# Patient Record
Sex: Female | Born: 1960 | Race: White | Hispanic: No | Marital: Married | Smoking: Current some day smoker
Health system: Southern US, Community
[De-identification: ages and names within clinical notes are randomized; demographics above are authoritative.]

## PROBLEM LIST (undated history)

## (undated) DIAGNOSIS — A64 Unspecified sexually transmitted disease: Secondary | ICD-10-CM

## (undated) DIAGNOSIS — J45909 Unspecified asthma, uncomplicated: Secondary | ICD-10-CM

## (undated) DIAGNOSIS — Z5189 Encounter for other specified aftercare: Secondary | ICD-10-CM

## (undated) DIAGNOSIS — C801 Malignant (primary) neoplasm, unspecified: Secondary | ICD-10-CM

## (undated) DIAGNOSIS — L570 Actinic keratosis: Secondary | ICD-10-CM

## (undated) DIAGNOSIS — G43109 Migraine with aura, not intractable, without status migrainosus: Secondary | ICD-10-CM

## (undated) DIAGNOSIS — D649 Anemia, unspecified: Secondary | ICD-10-CM

## (undated) DIAGNOSIS — R232 Flushing: Secondary | ICD-10-CM

## (undated) DIAGNOSIS — F419 Anxiety disorder, unspecified: Secondary | ICD-10-CM

## (undated) DIAGNOSIS — R87619 Unspecified abnormal cytological findings in specimens from cervix uteri: Secondary | ICD-10-CM

## (undated) DIAGNOSIS — A749 Chlamydial infection, unspecified: Secondary | ICD-10-CM

## (undated) DIAGNOSIS — A549 Gonococcal infection, unspecified: Secondary | ICD-10-CM

## (undated) DIAGNOSIS — F32A Depression, unspecified: Secondary | ICD-10-CM

## (undated) DIAGNOSIS — T7840XA Allergy, unspecified, initial encounter: Secondary | ICD-10-CM

## (undated) HISTORY — DX: Chlamydial infection, unspecified: A74.9

## (undated) HISTORY — PX: ECTOPIC PREGNANCY SURGERY: SHX613

## (undated) HISTORY — DX: Encounter for other specified aftercare: Z51.89

## (undated) HISTORY — DX: Migraine with aura, not intractable, without status migrainosus: G43.109

## (undated) HISTORY — PX: WISDOM TOOTH EXTRACTION: SHX21

## (undated) HISTORY — DX: Actinic keratosis: L57.0

## (undated) HISTORY — DX: Flushing: R23.2

## (undated) HISTORY — DX: Allergy, unspecified, initial encounter: T78.40XA

## (undated) HISTORY — DX: Anemia, unspecified: D64.9

## (undated) HISTORY — DX: Anxiety disorder, unspecified: F41.9

## (undated) HISTORY — PX: CRYOTHERAPY: SHX1416

## (undated) HISTORY — DX: Malignant (primary) neoplasm, unspecified: C80.1

## (undated) HISTORY — DX: Gonococcal infection, unspecified: A54.9

## (undated) HISTORY — PX: DILATION AND CURETTAGE OF UTERUS: SHX78

## (undated) HISTORY — DX: Unspecified asthma, uncomplicated: J45.909

## (undated) HISTORY — DX: Unspecified abnormal cytological findings in specimens from cervix uteri: R87.619

## (undated) HISTORY — DX: Depression, unspecified: F32.A

## (undated) HISTORY — DX: Unspecified sexually transmitted disease: A64

## (undated) HISTORY — PX: TUBAL LIGATION: SHX77

## (undated) HISTORY — PX: ABLATION: SHX5711

---

## 1998-11-16 ENCOUNTER — Encounter: Payer: Self-pay | Admitting: Infectious Diseases

## 1998-11-16 ENCOUNTER — Ambulatory Visit (HOSPITAL_COMMUNITY): Admission: RE | Admit: 1998-11-16 | Discharge: 1998-11-16 | Payer: Self-pay | Admitting: Infectious Diseases

## 1999-02-14 ENCOUNTER — Other Ambulatory Visit: Admission: RE | Admit: 1999-02-14 | Discharge: 1999-02-14 | Payer: Self-pay | Admitting: Obstetrics & Gynecology

## 2000-03-27 ENCOUNTER — Other Ambulatory Visit: Admission: RE | Admit: 2000-03-27 | Discharge: 2000-03-27 | Payer: Self-pay | Admitting: Obstetrics & Gynecology

## 2000-12-18 ENCOUNTER — Encounter: Payer: Self-pay | Admitting: Obstetrics & Gynecology

## 2000-12-18 ENCOUNTER — Encounter: Admission: RE | Admit: 2000-12-18 | Discharge: 2000-12-18 | Payer: Self-pay | Admitting: Family Medicine

## 2001-04-24 ENCOUNTER — Other Ambulatory Visit: Admission: RE | Admit: 2001-04-24 | Discharge: 2001-04-24 | Payer: Self-pay | Admitting: Obstetrics & Gynecology

## 2002-05-25 ENCOUNTER — Other Ambulatory Visit: Admission: RE | Admit: 2002-05-25 | Discharge: 2002-05-25 | Payer: Self-pay | Admitting: Obstetrics & Gynecology

## 2003-06-23 ENCOUNTER — Other Ambulatory Visit: Admission: RE | Admit: 2003-06-23 | Discharge: 2003-06-23 | Payer: Self-pay | Admitting: Obstetrics & Gynecology

## 2004-08-14 ENCOUNTER — Other Ambulatory Visit: Admission: RE | Admit: 2004-08-14 | Discharge: 2004-08-14 | Payer: Self-pay | Admitting: Obstetrics & Gynecology

## 2004-08-31 ENCOUNTER — Encounter: Admission: RE | Admit: 2004-08-31 | Discharge: 2004-08-31 | Payer: Self-pay | Admitting: Obstetrics & Gynecology

## 2005-09-27 ENCOUNTER — Other Ambulatory Visit: Admission: RE | Admit: 2005-09-27 | Discharge: 2005-09-27 | Payer: Self-pay | Admitting: Obstetrics & Gynecology

## 2007-08-15 ENCOUNTER — Ambulatory Visit: Payer: Self-pay | Admitting: Internal Medicine

## 2008-07-29 ENCOUNTER — Encounter: Admission: RE | Admit: 2008-07-29 | Discharge: 2008-07-29 | Payer: Self-pay | Admitting: Obstetrics & Gynecology

## 2008-08-31 ENCOUNTER — Ambulatory Visit: Payer: Self-pay | Admitting: Internal Medicine

## 2008-08-31 DIAGNOSIS — J309 Allergic rhinitis, unspecified: Secondary | ICD-10-CM | POA: Insufficient documentation

## 2008-08-31 DIAGNOSIS — Z8611 Personal history of tuberculosis: Secondary | ICD-10-CM | POA: Insufficient documentation

## 2008-08-31 DIAGNOSIS — J069 Acute upper respiratory infection, unspecified: Secondary | ICD-10-CM | POA: Insufficient documentation

## 2008-08-31 DIAGNOSIS — L509 Urticaria, unspecified: Secondary | ICD-10-CM | POA: Insufficient documentation

## 2009-09-22 ENCOUNTER — Encounter: Admission: RE | Admit: 2009-09-22 | Discharge: 2009-09-22 | Payer: Self-pay | Admitting: Obstetrics & Gynecology

## 2010-03-24 ENCOUNTER — Ambulatory Visit (HOSPITAL_COMMUNITY): Admission: RE | Admit: 2010-03-24 | Discharge: 2010-03-24 | Payer: Self-pay | Admitting: Gastroenterology

## 2010-07-18 ENCOUNTER — Encounter: Admission: RE | Admit: 2010-07-18 | Discharge: 2010-07-18 | Payer: Self-pay | Admitting: Obstetrics & Gynecology

## 2010-10-22 ENCOUNTER — Encounter: Payer: Self-pay | Admitting: Obstetrics & Gynecology

## 2011-01-16 ENCOUNTER — Other Ambulatory Visit: Payer: Self-pay | Admitting: Obstetrics & Gynecology

## 2011-01-16 DIAGNOSIS — Z1231 Encounter for screening mammogram for malignant neoplasm of breast: Secondary | ICD-10-CM

## 2011-01-22 ENCOUNTER — Ambulatory Visit
Admission: RE | Admit: 2011-01-22 | Discharge: 2011-01-22 | Disposition: A | Discharge status: Home / Self Care | Payer: 59 | Source: Ambulatory Visit | Attending: Obstetrics & Gynecology | Admitting: Obstetrics & Gynecology

## 2011-01-22 DIAGNOSIS — Z1231 Encounter for screening mammogram for malignant neoplasm of breast: Secondary | ICD-10-CM

## 2011-02-16 NOTE — Assessment & Plan Note (Signed)
[facility]                             PULMONARY OFFICE NOTE   Abigail Foley, Abigail Foley                   MRN:          [id number]  DATE:08/15/2007                            DOB:          [birth year]    Date of visit:  August 15, 2007.   PROBLEM:  Former patient at the old office wishing now to be seen here  for concern of cough.   HISTORY:  For the past six weeks she has had an intermittent cough most  days aggravated by rain and exertion.  It began in the first week of  October.  She feels tight around the chest but does not notice wheezing.  She started Mucinex.  This loosened some green or brown sputum at the  beginning, but that has cleared and only the cough has lingered.  She  may feel a little bit short of breath.  There is some post nasal drip  and complaint of halitosis from her husband, but no headache and she  blows nothing from her nose.  She had been told in the past that chest x-  ray questioned COPD and she does smoke.   MEDICATIONS:  Claritin, fish oil, multivitamin.   DRUG INTOLERANCES:  1. CECLOR with anaphylaxis.  2. FEMSTAT.   REVIEW OF SYSTEMS:  Dyspnea at rest and with exertion, productive and  nonproductive coughs.  Chest tightness.  No weight loss, adenopathy,  blood, joint pain, rash, changes in bowel or bladder, or leg edema.   PAST MEDICAL HISTORY:  1. She associated conjunctivitis with a flu vaccine many years ago.  2. No ENT surgery.  3. Previous urticaria, because of which she stays on daily Claritin      with no urticaria in a very long time.  4. Allergic rhinitis.  5. 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  6. Tubal pregnancy treated surgically in 1993.  7. Two C-sections.  8. Wisdom teeth extraction.  9. No intolerance to latex, contrast dye, or aspirin.   SOCIAL HISTORY:  She quit smoking two years ago, had averaged one half  pack per day.  Social alcohol.   Married with children.  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   FAMILY HISTORY:  Father with emphysema, mother with asthma, others with  heart disease, strokes, and aneurysm, and cancer.   OBJECTIVE:  Weight 167 pounds, blood pressure 102/64, pulse 96, room air  saturation 99%.  She appears comfortable.  HEENT:  There is a mild, dry cough.  Nose and throat are clear.  LUNGS:  Clear with no wheeze or rhonchi.  HEART:  Heart sounds are regular without murmur.  NECK:  I do not find adenopathy, neck vein distention, stridor,  cyanosis, clubbing, or edema.  ABDOMEN:  No enlargement of liver or spleen.   IMPRESSION:  1. Cough that seemed to begin with an episode of bronchitis and      possibly some sinusitis.  2. History of allergic rhinitis.  3. History of Ceclor-related anaphylaxis.  PLAN:  1. Biaxin 500 mg b.i.d. x7 days with one refill.  2. Sample Proventil HFA two puffs q.i.d. p.r.n.  3. Chest x-ray.  4. She will get flu vaccine at work.  5. She will return p.r.n. if this fails to clear her.     Clinton D. Maple Hudson, MD, Abigail Foley, FACP  Electronically Signed    CDY/MedQ  DD: 08/16/2007  DT: 08/18/2007  Job #: [id number]   cc:   Olene Craven, M.D.

## 2011-10-03 ENCOUNTER — Other Ambulatory Visit (HOSPITAL_COMMUNITY): Payer: Self-pay | Admitting: Obstetrics & Gynecology

## 2011-10-03 DIAGNOSIS — R1032 Left lower quadrant pain: Secondary | ICD-10-CM

## 2011-10-05 ENCOUNTER — Ambulatory Visit (HOSPITAL_COMMUNITY): Admission: RE | Admit: 2011-10-05 | Payer: 59 | Source: Ambulatory Visit

## 2011-10-12 ENCOUNTER — Ambulatory Visit (HOSPITAL_COMMUNITY): Admission: RE | Admit: 2011-10-12 | Payer: 59 | Source: Ambulatory Visit

## 2012-04-10 ENCOUNTER — Other Ambulatory Visit: Payer: Self-pay | Admitting: Obstetrics & Gynecology

## 2012-10-14 ENCOUNTER — Other Ambulatory Visit: Payer: Self-pay | Admitting: Obstetrics & Gynecology

## 2012-10-14 DIAGNOSIS — Z1231 Encounter for screening mammogram for malignant neoplasm of breast: Secondary | ICD-10-CM

## 2012-11-06 ENCOUNTER — Ambulatory Visit
Admission: RE | Admit: 2012-11-06 | Discharge: 2012-11-06 | Disposition: A | Discharge status: Home / Self Care | Payer: 59 | Source: Ambulatory Visit | Attending: Obstetrics & Gynecology | Admitting: Obstetrics & Gynecology

## 2012-11-06 DIAGNOSIS — Z1231 Encounter for screening mammogram for malignant neoplasm of breast: Secondary | ICD-10-CM

## 2013-07-28 ENCOUNTER — Other Ambulatory Visit: Payer: Self-pay | Admitting: *Deleted

## 2013-07-28 DIAGNOSIS — M79609 Pain in unspecified limb: Secondary | ICD-10-CM

## 2013-07-28 DIAGNOSIS — I83893 Varicose veins of bilateral lower extremities with other complications: Secondary | ICD-10-CM

## 2013-08-05 ENCOUNTER — Encounter: Payer: Self-pay | Admitting: Vascular Surgery

## 2013-08-06 ENCOUNTER — Encounter: Payer: Self-pay | Admitting: Vascular Surgery

## 2013-08-06 ENCOUNTER — Ambulatory Visit (HOSPITAL_COMMUNITY)
Admission: RE | Admit: 2013-08-06 | Discharge: 2013-08-06 | Disposition: A | Discharge status: Home / Self Care | Payer: 59 | Source: Ambulatory Visit | Attending: Vascular Surgery | Admitting: Vascular Surgery

## 2013-08-06 ENCOUNTER — Ambulatory Visit (INDEPENDENT_AMBULATORY_CARE_PROVIDER_SITE_OTHER): Payer: 59 | Admitting: Vascular Surgery

## 2013-08-06 VITALS — BP 117/64 | HR 76 | Ht 65.0 in | Wt 180.0 lb

## 2013-08-06 DIAGNOSIS — I83893 Varicose veins of bilateral lower extremities with other complications: Secondary | ICD-10-CM | POA: Insufficient documentation

## 2013-08-06 DIAGNOSIS — M79609 Pain in unspecified limb: Secondary | ICD-10-CM | POA: Insufficient documentation

## 2013-08-06 NOTE — Progress Notes (Signed)
[facility] [place] HISTORY AND PHYSICAL   History of Present Illness:  Patient is a 52 y.o. year old female who presents for evaluation of bilateral aching in her lower extremities. The patient has a long-term history of bilateral varicose veins. These have been present for several years. She has had no bleeding episodes. But she does have periodic bruising that occurs in the same place on the medial aspect of her calves.  She denies family history of varicose veins. She denies prior history of DVT. She does develop swelling in her legs which progressively worsened as the day goes on. 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 The swelling is somewhat relieved by elevation at the end of the day. 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 She works with [employer].  Past Medical History  Diagnosis Date  . Anemia     Past Surgical History  Procedure Laterality Date  . Tubal ligation    . Cesarean section    . Dilation and curettage of uterus      Social History History  Substance Use Topics  . Smoking status: Former Smoker    Types: Cigarettes    Quit date: 10/01/2005  . Smokeless tobacco: Never Used  . Alcohol Use: Yes     Comment: occasional    Family History Family History  Problem Relation Age of Onset  . Hypertension Mother   . Diabetes Father   . Hyperlipidemia Father   . Hyperlipidemia Sister   . Cancer Brother   . Hyperlipidemia Brother   . Hypertension Brother     Allergies  Allergies  Allergen Reactions  . Cefaclor     REACTION: anaphalaxis     Current Outpatient Prescriptions  Medication Sig Dispense Refill  . BIOTIN PO Take 10,000 mcg by mouth daily.      . Cholecalciferol (VITAMIN D3) 2000 UNITS capsule Take 2,000 Units by mouth daily.      . cyanocobalamin 1000 MCG tablet Take 100 mcg by mouth daily.      Marland Kitchen loratadine (CLARITIN) 10 MG tablet Take 10 mg by mouth daily.        No current facility-administered medications for this visit.    ROS:   General:  No weight loss, Fever, chills  HEENT: No recent headaches, no nasal bleeding, no visual changes, no sore throat  Neurologic: No dizziness, blackouts, seizures. No recent symptoms of stroke or mini- stroke. No recent episodes of slurred speech, or temporary blindness.  Cardiac: No recent episodes of chest pain/pressure, no shortness of breath at rest.  No shortness of breath with exertion.  Denies history of atrial fibrillation or irregular heartbeat  Vascular: No history of rest pain in feet.  No history of claudication.  No history of non-healing ulcer, No history of DVT   Pulmonary: No home oxygen, no productive cough, no hemoptysis,  No asthma or wheezing  Musculoskeletal:  [ ]  Arthritis, [ ]  Low back pain,  [ ]  Joint pain  Hematologic:No history of hypercoagulable state.  No history of easy bleeding.  No history of anemia  Gastrointestinal: No hematochezia or melena,  No gastroesophageal reflux, no trouble swallowing  Urinary: [ ]  chronic Kidney disease, [ ]  on HD - [ ]  MWF or [ ]  TTHS, [ ]  Burning with urination, [ ]  Frequent urination, [ ]  Difficulty urinating;   Skin: No rashes  Psychological: No history of anxiety,  No history of depression  Physical Examination  Filed Vitals:   08/06/13 1012  BP: 117/64  Pulse: 76  Height: 5\' 5"  (1.651 m)  Weight: 180 lb (81.647 kg)  SpO2: 100%    Body mass index is 29.95 kg/(m^2).  General:  Alert and oriented, no acute distress HEENT: Normal Neck: No bruit or JVD Pulmonary: Clear to auscultation bilaterally Cardiac: Regular Rate and Rhythm without murmur Abdomen: Soft, non-tender, non-distended, no mass Skin: No rash, multiple clusters of varicosities on the right medial calf 3-4 mm diameter. Scattered spider-type varicosities on bilateral thigh and calf area. Extremity Pulses:  2+ radial, brachial, femoral, 1+dorsalis pedis, 2+ posterior  tibial pulses bilaterally Musculoskeletal: No deformity trace edema bilaterally  Neurologic: Upper and lower extremity motor 5/5 and symmetric  DATA:  The patient had bilateral venous reflux exam today. This showed reflux in the greater saphenous vein on the left and right side. She also had reflux an accessory right vein. Vein diameter was 3-6 mm bilaterally. She did not have a significant element of deep vein reflux. I reviewed and interpreted this study.   ASSESSMENT:  Bilateral superficial venous reflux, symptomatic with swelling bruising pain   PLAN:  The patient was given a prescription today for bilateral lower extremity compression stockings. She was also counseled elevate her legs at the end of the day. She will use nonsteroidals intermittently for pain if necessary. She will followup in 3 months time to consider laser ablation.  Fabienne Bruns, MD [facility] [place] Office: [phone] Pager: [phone]

## 2013-11-03 ENCOUNTER — Ambulatory Visit: Payer: 59 | Admitting: Vascular Surgery

## 2013-12-21 ENCOUNTER — Ambulatory Visit (INDEPENDENT_AMBULATORY_CARE_PROVIDER_SITE_OTHER): Payer: 59 | Admitting: Family Medicine

## 2013-12-21 ENCOUNTER — Encounter: Payer: Self-pay | Admitting: Family Medicine

## 2013-12-21 ENCOUNTER — Ambulatory Visit: Payer: 59

## 2013-12-21 VITALS — BP 118/62 | HR 90 | Temp 98.0°F | Resp 16 | Wt 178.0 lb

## 2013-12-21 DIAGNOSIS — R7303 Prediabetes: Secondary | ICD-10-CM

## 2013-12-21 DIAGNOSIS — R200 Anesthesia of skin: Secondary | ICD-10-CM

## 2013-12-21 DIAGNOSIS — R209 Unspecified disturbances of skin sensation: Secondary | ICD-10-CM

## 2013-12-21 DIAGNOSIS — R7309 Other abnormal glucose: Secondary | ICD-10-CM

## 2013-12-21 LAB — POCT GLYCOSYLATED HEMOGLOBIN (HGB A1C): Hemoglobin A1C: 5.7

## 2013-12-21 NOTE — Patient Instructions (Signed)
Let met know if your symptoms do not resolve in the next few weeks.  Your A1c test shows that you have pre-diabetes.  Work on moderate weight loss to correct this problem.

## 2013-12-21 NOTE — Progress Notes (Signed)
[facility] [address], [place] [address] [phone]  Date:  12/21/2013   Name:  Abigail Foley   DOB:  1961/02/11   MRN:  [id number]  PCP:  Lamar Blinks, MD    Chief Complaint: Leg Pain   History of Present Illness:  Abigail Foley is a 53 y.o. very pleasant female patient who presents with the following:  She is here today with burning in her lower legs and feet for a month or two. It may have been going on longer- but she did not notice it as much prior to the last month or two. She had some spider veins treated and the sx seemed to start after this.  She just had superficial treatment with ?saline. No varicose vein treatment.   Her left leg burns more than the right.  She does not note any back pain.   Symptoms started insidiously, she is not aware of any injury  She has a feeling like she is "warming up after being cold."  She is better in the morning- the sx will often be resolved in the am, and then get worse at the day goes on.   No history of DM.  She does not have any other numbness or weakness anywhere, never had this in the past.  Never had any other unusual neurological findings.    Patient Active Problem List   Diagnosis Date Noted  . Varicose veins of lower extremities with other complications 75/64/3329  . Pain in limb 08/06/2013  . UPPER RESPIRATORY INFECTION 08/31/2008  . ALLERGIC RHINITIS 08/31/2008  . 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 Anemia     Past Surgical History  Procedure Laterality Date  . Tubal ligation    . Cesarean section    . Dilation and curettage of uterus      History  Substance Use Topics  . Smoking status: Former Smoker    Types: Cigarettes    Quit date: 10/01/2005  . Smokeless tobacco: Never Used  . Alcohol Use: Yes     Comment: occasional    Family History  Problem Relation Age of Onset  . Hypertension Mother   . Diabetes  Father   . Hyperlipidemia Father   . Hyperlipidemia Sister   . Cancer Brother   . Hyperlipidemia Brother   . Hypertension Brother     Allergies  Allergen Reactions  . Cefaclor     REACTION: anaphalaxis    Medication list has been reviewed and updated.  Current Outpatient Prescriptions on File Prior to Visit  Medication Sig Dispense Refill  . BIOTIN PO Take 10,000 mcg by mouth daily.      . Cholecalciferol (VITAMIN D3) 2000 UNITS capsule Take 2,000 Units by mouth daily.      . cyanocobalamin 1000 MCG tablet Take 100 mcg by mouth daily.      Marland Kitchen loratadine (CLARITIN) 10 MG tablet Take 10 mg by mouth daily.       No current facility-administered medications on file prior to visit.    Review of Systems:  As per HPI- otherwise negative.   Physical Examination: Filed Vitals:   12/21/13 0909  BP: 118/62  Pulse: 90  Temp: 98 F (36.7 C)  Resp: 16   Filed Vitals:   12/21/13 0909  Weight: 178 lb (80.74 kg)   Body mass index is 29.62 kg/(m^2). Ideal Body Weight:    GEN: WDWN,  NAD, Non-toxic, A & O x 3, overweight but otherwise looks well HEENT: Atraumatic, Normocephalic. Neck supple. No masses, No LAD. Ears and Nose: No external deformity. CV: RRR, No M/G/R. No JVD. No thrill. No extra heart sounds. PULM: CTA B, no wheezes, crackles, rhonchi. No retractions. No resp. distress. No accessory muscle use. ABD: S, NT, ND EXTR: No c/c/e NEURO Normal gait.  PSYCH: Normally interactive. Conversant. Not depressed or anxious appearing.  Calm demeanor.  Feet: normal perfusion and cap refill, normal monofilament sensation.  Good pulses.  Normal perfusion of lower legs.  No changes to skin.  Normal strength and DTR of both LE.  She indicates the main problem area as the lateral left foot, over the lateral ankle,    Wt Readings from Last 3 Encounters:  12/21/13 178 lb (80.74 kg)  08/06/13 180 lb (81.647 kg)  08/31/08 173 lb 6.1 oz (78.645 kg)     Results for orders placed in  visit on 12/21/13  POCT GLYCOSYLATED HEMOGLOBIN (HGB A1C)      Result Value Ref Range   Hemoglobin A1C 5.7     [facility] reading (PRIMARY) by  Dr. Lorelei Pont. Lumbar spine: negative  LUMBAR SPINE - COMPLETE 4+ VIEW  COMPARISON: None.  FINDINGS: Normal alignment of the lumbar vertebral bodies. Disc spaces and vertebral bodies are maintained. No acute bony findings or destructive bony changes. The facets are normally aligned. No pars defects. Mild facet disease the visualized bony pelvis is intact.  IMPRESSION: Normal alignment and no acute bony findings  Assessment and Plan: Numbness in feet - Plan: POCT glycosylated hemoglobin (Hb A1C), DG Lumbar Spine Complete  Noted that she has pre- diabetes. Encouraged her to lose a few lbs to help with this.  Otherwise her exam and films look good. discussed trying a round of steroids but she would rather not do this.  She will let me know if not better soon; if not better will look further.     Signed Lamar Blinks, MD

## 2014-02-01 ENCOUNTER — Encounter: Payer: Self-pay | Admitting: Family Medicine

## 2014-02-01 ENCOUNTER — Ambulatory Visit (INDEPENDENT_AMBULATORY_CARE_PROVIDER_SITE_OTHER): Payer: 59 | Admitting: Family Medicine

## 2014-02-01 VITALS — BP 109/71 | HR 71 | Temp 97.0°F | Resp 18 | Ht 65.5 in | Wt 177.0 lb

## 2014-02-01 DIAGNOSIS — Z124 Encounter for screening for malignant neoplasm of cervix: Secondary | ICD-10-CM

## 2014-02-01 DIAGNOSIS — Z0000 Encounter for general adult medical examination without abnormal findings: Secondary | ICD-10-CM

## 2014-02-01 DIAGNOSIS — Z1329 Encounter for screening for other suspected endocrine disorder: Secondary | ICD-10-CM

## 2014-02-01 DIAGNOSIS — R7303 Prediabetes: Secondary | ICD-10-CM

## 2014-02-01 DIAGNOSIS — N390 Urinary tract infection, site not specified: Secondary | ICD-10-CM

## 2014-02-01 DIAGNOSIS — Z13220 Encounter for screening for lipoid disorders: Secondary | ICD-10-CM

## 2014-02-01 DIAGNOSIS — M545 Low back pain, unspecified: Secondary | ICD-10-CM

## 2014-02-01 LAB — CBC WITH DIFFERENTIAL/PLATELET
BASOS ABS: 0.1 10*3/uL (ref 0.0–0.1)
BASOS PCT: 1 % (ref 0–1)
EOS ABS: 0.2 10*3/uL (ref 0.0–0.7)
Eosinophils Relative: 4 % (ref 0–5)
HCT: 37.1 % (ref 36.0–46.0)
Hemoglobin: 12.4 g/dL (ref 12.0–15.0)
Lymphocytes Relative: 35 % (ref 12–46)
Lymphs Abs: 2 10*3/uL (ref 0.7–4.0)
MCH: 29.6 pg (ref 26.0–34.0)
MCHC: 33.4 g/dL (ref 30.0–36.0)
MCV: 88.5 fL (ref 78.0–100.0)
Monocytes Absolute: 0.3 10*3/uL (ref 0.1–1.0)
Monocytes Relative: 6 % (ref 3–12)
NEUTROS PCT: 54 % (ref 43–77)
Neutro Abs: 3.1 10*3/uL (ref 1.7–7.7)
PLATELETS: 289 10*3/uL (ref 150–400)
RBC: 4.19 MIL/uL (ref 3.87–5.11)
RDW: 13.6 % (ref 11.5–15.5)
WBC: 5.8 10*3/uL (ref 4.0–10.5)

## 2014-02-01 LAB — LIPID PANEL
Cholesterol: 189 mg/dL (ref 0–200)
HDL: 47 mg/dL (ref >39)
LDL CALC: 110 mg/dL — AB (ref 0–99)
Total CHOL/HDL Ratio: 4 Ratio
Triglycerides: 162 mg/dL — ABNORMAL HIGH (ref <150)
VLDL: 32 mg/dL (ref 0–40)

## 2014-02-01 LAB — POCT URINALYSIS DIPSTICK
Bilirubin, UA: NEGATIVE
GLUCOSE UA: NEGATIVE
Ketones, UA: NEGATIVE
NITRITE UA: NEGATIVE
PROTEIN UA: NEGATIVE
Spec Grav, UA: <=1.005
UROBILINOGEN UA: 0.2
pH, UA: 6

## 2014-02-01 LAB — TSH: TSH: 1.22 u[IU]/mL (ref 0.350–4.500)

## 2014-02-01 LAB — COMPREHENSIVE METABOLIC PANEL
ALK PHOS: 68 U/L (ref 39–117)
ALT: 13 U/L (ref 0–35)
AST: 15 U/L (ref 0–37)
Albumin: 4.3 g/dL (ref 3.5–5.2)
BILIRUBIN TOTAL: 0.3 mg/dL (ref 0.2–1.2)
BUN: 13 mg/dL (ref 6–23)
CO2: 26 mEq/L (ref 19–32)
Calcium: 9.3 mg/dL (ref 8.4–10.5)
Chloride: 108 mEq/L (ref 96–112)
Creat: 0.74 mg/dL (ref 0.50–1.10)
Glucose, Bld: 94 mg/dL (ref 70–99)
Potassium: 4.4 mEq/L (ref 3.5–5.3)
SODIUM: 140 meq/L (ref 135–145)
TOTAL PROTEIN: 6.9 g/dL (ref 6.0–8.3)

## 2014-02-01 LAB — POCT UA - MICROSCOPIC ONLY
CASTS, UR, LPF, POC: NEGATIVE
Crystals, Ur, HPF, POC: NEGATIVE
MUCUS UA: NEGATIVE
YEAST UA: NEGATIVE

## 2014-02-01 LAB — HEMOGLOBIN A1C
HEMOGLOBIN A1C: 5.8 % — AB (ref <5.7)
MEAN PLASMA GLUCOSE: 120 mg/dL — AB (ref <117)

## 2014-02-01 NOTE — Patient Instructions (Addendum)
Great to see you again today Your blood pressure looks very good. I will be in touch with your labs via mychart Please check and see if you had the "tdap" vaccine already through work.  If not we can do this for you next time, or you can probably get it at the hospital Continue to do cardio and stay as active as possible. However, you may want to add some weight training to your exercise routine to build calorie burning muscle!

## 2014-02-01 NOTE — Progress Notes (Addendum)
[facility] [address] [place] [address] [phone]  Date:  02/01/2014   Name:  Abigail Foley   DOB:  10-07-1960   MRN:  [id number]  PCP:  Lamar Blinks, MD    Chief Complaint: Annual Exam   History of Present Illness:  Abigail Foley is a 53 y.o. very pleasant female patient who presents with the following:  Generally healthy woman here today for a CPE.   A1c earlier this year showed borderline elevation at 5.7%.  She had noted some tingling in her feet at that time.  Planned to have her work on her weight and follow-up if persistent. She noted that this got better, but now seems to be on and off.  She does not see any pattern, except sitting for long periods of time. When it is present it may be in both feet, but is usually in her left leg.    She has a history of constipation.  Last week she had BRBPR when she had a BM.  The blood was on the tissue, she did not really notice any pain when she used the bathroom.  She does use stool softeners   Wt Readings from Last 3 Encounters:  02/01/14 177 lb (80.287 kg)  12/21/13 178 lb (80.74 kg)  08/06/13 180 lb (81.647 kg)   She is fasting today for labs She is a pt at [facility]- saw Dr. Nori Riis last month and they plan to repeat in 6 months for abnormality.  She is post- menopausal and s/p BTL  She is a former smoker, drinks alcohol on occasion.   She was treated for a positive PPD about 20 years ago.   She is not sure of the date of her last tetanus shot Her mammogram is a little bit overdue- she plans to do this asap.   Patient Active Problem List   Diagnosis Date Noted  . Varicose veins of lower extremities with other complications 54/49/2010  . Pain in limb 08/06/2013  . UPPER RESPIRATORY INFECTION 08/31/2008  . ALLERGIC RHINITIS 08/31/2008  . 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 Anemia     Past Surgical History   Procedure Laterality Date  . Tubal ligation    . Cesarean section    . Dilation and curettage of uterus      History  Substance Use Topics  . Smoking status: Former Smoker    Types: Cigarettes    Quit date: 10/01/2005  . Smokeless tobacco: Never Used  . Alcohol Use: Yes     Comment: occasional    Family History  Problem Relation Age of Onset  . Hypertension Mother   . Diabetes Father   . Hyperlipidemia Father   . Hyperlipidemia Sister   . Cancer Brother   . Hyperlipidemia Brother   . Hypertension Brother     Allergies  Allergen Reactions  . Cefaclor     REACTION: anaphalaxis  . Sulfa Antibiotics Hives    Medication list has been reviewed and updated.  Current Outpatient Prescriptions on File Prior to Visit  Medication Sig Dispense Refill  . BIOTIN PO Take 10,000 mcg by mouth daily.      . Cholecalciferol (VITAMIN D3) 2000 UNITS capsule Take 2,000 Units by mouth daily.      . cyanocobalamin 1000 MCG tablet Take 100 mcg by mouth daily.      Marland Kitchen loratadine (CLARITIN) 10 MG  tablet Take 10 mg by mouth daily.       No current facility-administered medications on file prior to visit.    Review of Systems:  As per HPI- otherwise negative.   Physical Examination: Filed Vitals:   02/01/14 0811  BP: 109/71  Pulse: 71  Temp: 97 F (36.1 C)  Resp: 18   Filed Vitals:   02/01/14 0811  Height: 5' 5.5" (1.664 m)  Weight: 177 lb (80.287 kg)   Body mass index is 29 kg/(m^2). Ideal Body Weight: Weight in (lb) to have BMI = 25: 152.2  GEN: WDWN, NAD, Non-toxic, A & O x 3, overweight, looks well HEENT: Atraumatic, Normocephalic. Neck supple. No masses, No LAD.  Bilateral TM wnl, oropharynx normal.  PEERL,EOMI.   Ears and Nose: No external deformity. CV: RRR, No M/G/R. No JVD. No thrill. No extra heart sounds. PULM: CTA B, no wheezes, crackles, rhonchi. No retractions. No resp. distress. No accessory muscle use. ABD: S, NT, ND, +BS. No rebound. No HSM. EXTR: No  c/c/e.  Normal perfusion, healthy appearance of bilateral feet NEURO Normal gait.  PSYCH: Normally interactive. Conversant. Not depressed or anxious appearing.  Calm demeanor.  Defer breast and pelvic; this is done per her OBG Rectal exam: no visible external hemorrhoids or fissures.    Results for orders placed in visit on 02/01/14  POCT UA - MICROSCOPIC ONLY      Result Value Ref Range   WBC, Ur, HPF, POC 2-4     RBC, urine, microscopic 3-4     Bacteria, U Microscopic 1+     Mucus, UA neg     Epithelial cells, urine per micros 2-4     Crystals, Ur, HPF, POC neg     Casts, Ur, LPF, POC neg     Yeast, UA neg    POCT URINALYSIS DIPSTICK      Result Value Ref Range   Color, UA pale     Clarity, UA clear     Glucose, UA neg     Bilirubin, UA neg     Ketones, UA neg     Spec Grav, UA <=1.005     Blood, UA trace     pH, UA 6.0     Protein, UA neg     Urobilinogen, UA 0.2     Nitrite, UA neg     Leukocytes, UA small (1+)       Assessment and Plan: Physical exam  Screening for hyperlipidemia - Plan: Lipid panel  Screening for cervical cancer  Screening for hypothyroidism - Plan: CBC with Differential, Comprehensive metabolic panel, TSH  Borderline diabetes - Plan: Hemoglobin A1c  Lower back pain - Plan: POCT UA - Microscopic Only, POCT urinalysis dipstick, Urine culture  CPE today as above See patient instructions for more details.   If rectal bleeding persists she will follow-up with Dr. Collene Mares.  Recheck A1c today to follow-up borderline elevation. Consider trial of metformin Otherwise plan further follow-up pending her labs.   Signed Lamar Blinks, MD  Await urine culture- her UA suggests a UTI but she had a somewhat dirty catch Received urine, sent in an rx for macrobid for her as she is cephalosporin allergic.  Have sent a message to her via mychart  Results for orders placed in visit on 02/01/14  URINE CULTURE      Result Value Ref Range   Colony Count 40,000  COLONIES/ML     Organism ID, Bacteria GROUP B STREP (S.AGALACTIAE) ISOLATED  CBC WITH DIFFERENTIAL      Result Value Ref Range   WBC 5.8  4.0 - 10.5 K/uL   RBC 4.19  3.87 - 5.11 MIL/uL   Hemoglobin 12.4  12.0 - 15.0 g/dL   HCT 37.1  36.0 - 46.0 %   MCV 88.5  78.0 - 100.0 fL   MCH 29.6  26.0 - 34.0 pg   MCHC 33.4  30.0 - 36.0 g/dL   RDW 13.6  11.5 - 15.5 %   Platelets 289  150 - 400 K/uL   Neutrophils Relative % 54  43 - 77 %   Neutro Abs 3.1  1.7 - 7.7 K/uL   Lymphocytes Relative 35  12 - 46 %   Lymphs Abs 2.0  0.7 - 4.0 K/uL   Monocytes Relative 6  3 - 12 %   Monocytes Absolute 0.3  0.1 - 1.0 K/uL   Eosinophils Relative 4  0 - 5 %   Eosinophils Absolute 0.2  0.0 - 0.7 K/uL   Basophils Relative 1  0 - 1 %   Basophils Absolute 0.1  0.0 - 0.1 K/uL   Smear Review Criteria for review not met    COMPREHENSIVE METABOLIC PANEL      Result Value Ref Range   Sodium 140  135 - 145 mEq/L   Potassium 4.4  3.5 - 5.3 mEq/L   Chloride 108  96 - 112 mEq/L   CO2 26  19 - 32 mEq/L   Glucose, Bld 94  70 - 99 mg/dL   BUN 13  6 - 23 mg/dL   Creat 0.74  0.50 - 1.10 mg/dL   Total Bilirubin 0.3  0.2 - 1.2 mg/dL   Alkaline Phosphatase 68  39 - 117 U/L   AST 15  0 - 37 U/L   ALT 13  0 - 35 U/L   Total Protein 6.9  6.0 - 8.3 g/dL   Albumin 4.3  3.5 - 5.2 g/dL   Calcium 9.3  8.4 - 10.5 mg/dL  LIPID PANEL      Result Value Ref Range   Cholesterol 189  0 - 200 mg/dL   Triglycerides 162 (*) <150 mg/dL   HDL 47  >39 mg/dL   Total CHOL/HDL Ratio 4.0     VLDL 32  0 - 40 mg/dL   LDL Cholesterol 110 (*) 0 - 99 mg/dL  TSH      Result Value Ref Range   TSH 1.220  0.350 - 4.500 uIU/mL  HEMOGLOBIN A1C      Result Value Ref Range   Hemoglobin A1C 5.8 (*) <5.7 %   Mean Plasma Glucose 120 (*) <117 mg/dL  POCT UA - MICROSCOPIC ONLY      Result Value Ref Range   WBC, Ur, HPF, POC 2-4     RBC, urine, microscopic 3-4     Bacteria, U Microscopic 1+     Mucus, UA neg     Epithelial cells, urine per  micros 2-4     Crystals, Ur, HPF, POC neg     Casts, Ur, LPF, POC neg     Yeast, UA neg    POCT URINALYSIS DIPSTICK      Result Value Ref Range   Color, UA pale     Clarity, UA clear     Glucose, UA neg     Bilirubin, UA neg     Ketones, UA neg     Spec Grav, UA <=1.005  Blood, UA trace     pH, UA 6.0     Protein, UA neg     Urobilinogen, UA 0.2     Nitrite, UA neg     Leukocytes, UA small (1+)     Creat clearance is over 200

## 2014-02-02 LAB — URINE CULTURE: Colony Count: 40000

## 2014-02-02 MED ORDER — NITROFURANTOIN MONOHYD MACRO 100 MG PO CAPS: 100.0000 mg | ORAL_CAPSULE | Freq: Two times a day (BID) | ORAL | Status: DC

## 2014-02-02 NOTE — Addendum Note (Signed)
Addended by: Lamar Blinks C on: 02/02/2014 09:16 PM   Modules accepted: Orders

## 2014-02-10 ENCOUNTER — Telehealth: Payer: Self-pay | Admitting: Family Medicine

## 2014-02-10 ENCOUNTER — Encounter: Payer: Self-pay | Admitting: Family Medicine

## 2014-02-10 DIAGNOSIS — N390 Urinary tract infection, site not specified: Secondary | ICD-10-CM

## 2014-02-10 NOTE — Telephone Encounter (Signed)
Called and LMOM.  I did not hear back from her regarding if she wants to try some metformin.  If she will send me a mychart message or call that would be great. Also, wanted to be sure she got my message about taking macrobid for her UTI.  Please come in for a lab visit only in the next month or so for a recheck UA and micro

## 2014-02-12 ENCOUNTER — Other Ambulatory Visit: Payer: Self-pay

## 2014-02-12 DIAGNOSIS — Z1231 Encounter for screening mammogram for malignant neoplasm of breast: Secondary | ICD-10-CM

## 2014-02-19 ENCOUNTER — Other Ambulatory Visit (INDEPENDENT_AMBULATORY_CARE_PROVIDER_SITE_OTHER): Payer: 59 | Admitting: Family Medicine

## 2014-02-19 DIAGNOSIS — N390 Urinary tract infection, site not specified: Secondary | ICD-10-CM

## 2014-02-19 LAB — POCT UA - MICROSCOPIC ONLY
Bacteria, U Microscopic: NEGATIVE
CASTS, UR, LPF, POC: NEGATIVE
CRYSTALS, UR, HPF, POC: NEGATIVE
MUCUS UA: NEGATIVE
YEAST UA: NEGATIVE

## 2014-02-19 LAB — POCT URINALYSIS DIPSTICK
Bilirubin, UA: NEGATIVE
GLUCOSE UA: NEGATIVE
Ketones, UA: NEGATIVE
NITRITE UA: NEGATIVE
PH UA: 5.5
PROTEIN UA: NEGATIVE
Spec Grav, UA: <=1.005
UROBILINOGEN UA: 0.2

## 2014-02-19 NOTE — Progress Notes (Signed)
U/A with micro ordered and performed.

## 2014-02-23 ENCOUNTER — Encounter: Payer: Self-pay | Admitting: Family Medicine

## 2014-03-04 ENCOUNTER — Ambulatory Visit
Admission: RE | Admit: 2014-03-04 | Discharge: 2014-03-04 | Disposition: A | Discharge status: Home / Self Care | Payer: 59 | Source: Ambulatory Visit

## 2014-03-04 DIAGNOSIS — Z1231 Encounter for screening mammogram for malignant neoplasm of breast: Secondary | ICD-10-CM

## 2014-07-19 ENCOUNTER — Other Ambulatory Visit: Payer: Self-pay | Admitting: Obstetrics & Gynecology

## 2014-07-20 LAB — CYTOLOGY - PAP

## 2014-09-30 ENCOUNTER — Ambulatory Visit (INDEPENDENT_AMBULATORY_CARE_PROVIDER_SITE_OTHER): Payer: 59 | Admitting: Physician Assistant

## 2014-09-30 VITALS — BP 108/64 | HR 85 | Temp 97.8°F | Resp 16 | Ht 65.25 in | Wt 181.0 lb

## 2014-09-30 DIAGNOSIS — M769 Unspecified enthesopathy, lower limb, excluding foot: Secondary | ICD-10-CM

## 2014-09-30 DIAGNOSIS — M76899 Other specified enthesopathies of unspecified lower limb, excluding foot: Secondary | ICD-10-CM

## 2014-09-30 MED ORDER — MELOXICAM 15 MG PO TABS: 15.0000 mg | ORAL_TABLET | Freq: Every day | ORAL | Status: DC

## 2014-09-30 NOTE — Progress Notes (Signed)
Subjective:    Patient ID: Abigail Foley, female    DOB: 25-Aug-1961, 54 y.o.   MRN: [id number]  HPI  This is a 53 year old female presenting with a tender spot on right knee x 2-3 weeks. Pain is located to superolateral patella. She cannot recall injury. She has not recently increased or changed her activity. Over past couple days she noticed some slight swelling at site of pain. She has not had any erythema or bruising. No pain with walking or movement, only with palpation. She has not tried anything for the pain because it "hasn't been that bad". She does not have a history of arthritis.  Review of Systems Patient Active Problem List   Diagnosis Date Noted  . Varicose veins of lower extremities with other complications 74/08/8785  . Pain in limb 08/06/2013  . UPPER RESPIRATORY INFECTION 08/31/2008  . ALLERGIC RHINITIS 08/31/2008  . 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 POSITIVE PPD 08/31/2008   Prior to Admission medications   Medication Sig Start Date End Date Taking? Authorizing Provider  BIOTIN PO Take 10,000 mcg by mouth daily.   Yes Historical Provider, MD  Cholecalciferol (VITAMIN D3) 2000 UNITS capsule Take 2,000 Units by mouth daily.   Yes Historical Provider, MD  cyanocobalamin 1000 MCG tablet Take 100 mcg by mouth daily.   Yes Historical Provider, MD  loratadine (CLARITIN) 10 MG tablet Take 10 mg by mouth daily.   Yes Historical Provider, MD  MINOXIDIL, TOPICAL, 5 % SOLN Apply topically.   Yes Historical Provider, MD  Multiple Vitamin (MULTI VITAMIN DAILY PO) Take by mouth.   Yes Historical Provider, MD  nitrofurantoin, macrocrystal-monohydrate, (MACROBID) 100 MG capsule Take 1 capsule (100 mg total) by mouth 2 (two) times daily. 02/02/14  Yes Darreld Mclean, MD   Allergies  Allergen Reactions  . Cefaclor     REACTION: anaphalaxis  . Sulfa Antibiotics Hives   Patient's social and family history were reviewed.     Objective:   Physical Exam  Constitutional: She is  oriented to person, place, and time. She appears well-developed and well-nourished. No distress.  HENT:  Head: Normocephalic and atraumatic.  Right Ear: Hearing normal.  Left Ear: Hearing normal.  Nose: Nose normal.  Eyes: Conjunctivae and lids are normal. Right eye exhibits no discharge. Left eye exhibits no discharge. No scleral icterus.  Cardiovascular: Normal rate and regular rhythm.   Pulmonary/Chest: Effort normal. No respiratory distress.  Musculoskeletal: Normal range of motion.       Right knee: She exhibits swelling (very mild, superolateral to patella). She exhibits normal range of motion, no effusion, no ecchymosis and normal patellar mobility. Tenderness (at area of swelling, over lateral quadriceps insertion) found. No medial joint line, no lateral joint line, no MCL, no LCL and no patellar tendon tenderness noted.       Legs: Neurological: She is alert and oriented to person, place, and time. She has normal strength and normal reflexes. No sensory deficit.  Reflex Scores:      Bicep reflexes are 2+ on the right side and 2+ on the left side. Skin: Skin is warm, dry and intact. No lesion and no rash noted.  Psychiatric: She has a normal mood and affect. Her speech is normal and behavior is normal. Thought content normal.   BP 108/64 mmHg  Pulse 85  Temp(Src) 97.8 F (36.6 C) (Oral)  Resp 16  Ht 5' 5.25" (1.657 m)  Wt 181 lb (82.101 kg)  BMI 29.90 kg/m2  SpO2 100%     Assessment & Plan:  1. Quadriceps tendonitis She likely has tendonitis. She will try mobic and ice. She will return in 2-3 weeks if not improving.  - meloxicam (MOBIC) 15 MG tablet; Take 1 tablet (15 mg total) by mouth daily.  Dispense: 30 tablet; Refill: 1   Noelani Harbach V. Drenda Freeze, MHS [facility]  09/30/2014

## 2014-09-30 NOTE — Patient Instructions (Signed)
Take mobic once a day. Do not take any other anti-inflammatories with it. Massage area with ice 3-4 times a day. Return if not improving in 3-4 weeks.

## 2014-09-30 NOTE — Progress Notes (Signed)
The patient was discussed with me and I agree with the diagnosis and treatment plan.  

## 2015-02-01 ENCOUNTER — Other Ambulatory Visit: Payer: Self-pay | Admitting: Obstetrics & Gynecology

## 2015-02-02 LAB — CYTOLOGY - PAP

## 2015-08-17 ENCOUNTER — Other Ambulatory Visit: Payer: Self-pay

## 2015-08-17 DIAGNOSIS — Z1231 Encounter for screening mammogram for malignant neoplasm of breast: Secondary | ICD-10-CM

## 2015-09-14 ENCOUNTER — Encounter: Payer: Self-pay | Admitting: Family Medicine

## 2015-09-14 ENCOUNTER — Ambulatory Visit (INDEPENDENT_AMBULATORY_CARE_PROVIDER_SITE_OTHER): Payer: 59 | Admitting: Family Medicine

## 2015-09-14 VITALS — BP 100/63 | HR 71 | Temp 98.1°F | Resp 16 | Ht 65.25 in | Wt 172.0 lb

## 2015-09-14 DIAGNOSIS — R253 Fasciculation: Secondary | ICD-10-CM

## 2015-09-14 DIAGNOSIS — M542 Cervicalgia: Secondary | ICD-10-CM

## 2015-09-14 DIAGNOSIS — R259 Unspecified abnormal involuntary movements: Secondary | ICD-10-CM

## 2015-09-14 LAB — TSH: TSH: 1.178 u[IU]/mL (ref 0.350–4.500)

## 2015-09-14 LAB — COMPREHENSIVE METABOLIC PANEL
ALBUMIN: 4.2 g/dL (ref 3.6–5.1)
ALT: 16 U/L (ref 6–29)
AST: 18 U/L (ref 10–35)
Alkaline Phosphatase: 77 U/L (ref 33–130)
BUN: 14 mg/dL (ref 7–25)
CALCIUM: 9.1 mg/dL (ref 8.6–10.4)
CHLORIDE: 105 mmol/L (ref 98–110)
CO2: 27 mmol/L (ref 20–31)
CREATININE: 0.85 mg/dL (ref 0.50–1.05)
Glucose, Bld: 94 mg/dL (ref 65–99)
POTASSIUM: 4 mmol/L (ref 3.5–5.3)
SODIUM: 139 mmol/L (ref 135–146)
TOTAL PROTEIN: 6.5 g/dL (ref 6.1–8.1)
Total Bilirubin: 0.3 mg/dL (ref 0.2–1.2)

## 2015-09-14 LAB — CBC
HEMATOCRIT: 37.2 % (ref 36.0–46.0)
HEMOGLOBIN: 12.6 g/dL (ref 12.0–15.0)
MCH: 31.6 pg (ref 26.0–34.0)
MCHC: 33.9 g/dL (ref 30.0–36.0)
MCV: 93.2 fL (ref 78.0–100.0)
MPV: 9.8 fL (ref 8.6–12.4)
Platelets: 269 10*3/uL (ref 150–400)
RBC: 3.99 MIL/uL (ref 3.87–5.11)
RDW: 12.3 % (ref 11.5–15.5)
WBC: 6.3 10*3/uL (ref 4.0–10.5)

## 2015-09-14 NOTE — Progress Notes (Signed)
[facility] [address], [place] [address] [phone]  Date:  09/14/2015   Name:  Abigail Foley   DOB:  [birth year]   MRN:  [id number]  PCP:  Lamar Blinks, MD    Chief Complaint: Eye Problem and Neck Pain   History of Present Illness:  Abigail Foley is a 54 y.o. very pleasant female patient who presents with the following:  Generally healthy woman here today with concern about  A twitch under her eye that was present since may of this year- however over the last week or so it seemed to get better.  The twitch then moved to over her eye but seems to have stopped there as well  She also has noted a "pressure, it's like a soreness" in the right side of her neck that has been present for a few months.  She would only notice it if she flexes her neck in a certain way.    No swallowing difficulty.      Patient Active Problem List   Diagnosis Date Noted  . Varicose veins of lower extremities with other complications 0000000  . Pain in limb 08/06/2013  . UPPER RESPIRATORY INFECTION 08/31/2008  . ALLERGIC RHINITIS 08/31/2008  . 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 Anemia     Past Surgical History  Procedure Laterality Date  . Tubal ligation    . Cesarean section    . Dilation and curettage of uterus      Social History  Substance Use Topics  . Smoking status: Former Smoker    Types: Cigarettes    Quit date: 10/01/2005  . Smokeless tobacco: Never Used  . Alcohol Use: Yes     Comment: occasional    Family History  Problem Relation Age of Onset  . Hypertension Mother   . Diabetes Father   . Hyperlipidemia Father   . Hyperlipidemia Sister   . Cancer Brother   . Hyperlipidemia Brother   . Hypertension Brother     Allergies  Allergen Reactions  . Cefaclor     REACTION: anaphalaxis  . Sulfa Antibiotics Hives    Medication list has been reviewed and  updated.  Current Outpatient Prescriptions on File Prior to Visit  Medication Sig Dispense Refill  . BIOTIN PO Take 10,000 mcg by mouth daily.    . Cholecalciferol (VITAMIN D3) 2000 UNITS capsule Take 2,000 Units by mouth daily.    . cyanocobalamin 1000 MCG tablet Take 100 mcg by mouth daily.    Marland Kitchen loratadine (CLARITIN) 10 MG tablet Take 10 mg by mouth daily.    . meloxicam (MOBIC) 15 MG tablet Take 1 tablet (15 mg total) by mouth daily. (Patient not taking: Reported on 09/14/2015) 30 tablet 1  . MINOXIDIL, TOPICAL, 5 % SOLN Apply topically. Reported on 09/14/2015    . Multiple Vitamin (MULTI VITAMIN DAILY PO) Take by mouth. Reported on 09/14/2015    . nitrofurantoin, macrocrystal-monohydrate, (MACROBID) 100 MG capsule Take 1 capsule (100 mg total) by mouth 2 (two) times daily. (Patient not taking: Reported on 09/14/2015) 14 capsule 0   No current facility-administered medications on file prior to visit.    Review of Systems:  As per HPI- otherwise negative.   Physical Examination: Filed Vitals:   09/14/15 1619  BP: 100/63  Pulse: 71  Temp: 98.1 F (36.7 C)  Resp: 16   Filed Vitals:   09/14/15 1619  Height: 5' 5.25" (1.657 m)  Weight: 172 lb (78.019 kg)   Body mass index is 28.42 kg/(m^2). Ideal Body Weight: Weight in (lb) to have BMI = 25: 151.1  GEN: WDWN, NAD, Non-toxic, A & O x 3, mild overweight, looks well and younger than age 12: Atraumatic, Normocephalic. Neck supple. No masses, No LAD.  Bilateral TM wnl, oropharynx normal.  PEERL,EOMI.   Normal fundoscopic exam She has mild tenderness over the right SCM muscle,  Normal cervical ROM Ears and Nose: No external deformity. CV: RRR, No M/G/R. No JVD. No thrill. No extra heart sounds. PULM: CTA B, no wheezes, crackles, rhonchi. No retractions. No resp. distress. No accessory muscle use. ABD: S, NT, ND, +BS. No rebound. No HSM. EXTR: No c/c/e NEURO Normal gait. Normal strength of all extremities and of facial  muscles.  Normal DTR, negative romberg PSYCH: Normally interactive. Conversant. Not depressed or anxious appearing.  Calm demeanor.    Assessment and Plan: Eye muscle twitches - Plan: Comprehensive metabolic panel, TSH, CBC  Sore neck  She has not had labs in some time so will check basics for her today. Offered to order and [place] of her neck but she declines for now, she feels reassured.  She will let me know if her sx return   Signed Lamar Blinks, MD

## 2015-09-14 NOTE — Patient Instructions (Signed)
It was good to see you today- I will send you your labs results over mychart asap Take care and let me know if your twitches comes back or if your neck does not get better

## 2015-09-21 ENCOUNTER — Ambulatory Visit
Admission: RE | Admit: 2015-09-21 | Discharge: 2015-09-21 | Disposition: A | Discharge status: Home / Self Care | Payer: 59 | Source: Ambulatory Visit

## 2015-09-21 DIAGNOSIS — Z1231 Encounter for screening mammogram for malignant neoplasm of breast: Secondary | ICD-10-CM

## 2015-10-21 ENCOUNTER — Encounter: Payer: Self-pay | Admitting: Family Medicine

## 2015-10-26 ENCOUNTER — Encounter: Payer: Self-pay | Admitting: Family Medicine

## 2015-11-01 DIAGNOSIS — H40013 Open angle with borderline findings, low risk, bilateral: Secondary | ICD-10-CM | POA: Diagnosis not present

## 2015-11-01 DIAGNOSIS — H2513 Age-related nuclear cataract, bilateral: Secondary | ICD-10-CM | POA: Diagnosis not present

## 2015-11-01 DIAGNOSIS — H43393 Other vitreous opacities, bilateral: Secondary | ICD-10-CM | POA: Diagnosis not present

## 2015-11-01 DIAGNOSIS — H25013 Cortical age-related cataract, bilateral: Secondary | ICD-10-CM | POA: Diagnosis not present

## 2015-11-16 ENCOUNTER — Ambulatory Visit (INDEPENDENT_AMBULATORY_CARE_PROVIDER_SITE_OTHER): Payer: 59 | Admitting: Podiatry

## 2015-11-16 ENCOUNTER — Ambulatory Visit (INDEPENDENT_AMBULATORY_CARE_PROVIDER_SITE_OTHER): Payer: 59

## 2015-11-16 ENCOUNTER — Encounter: Payer: Self-pay | Admitting: Podiatry

## 2015-11-16 VITALS — BP 111/65 | HR 71 | Resp 16 | Ht 65.0 in | Wt 169.0 lb

## 2015-11-16 DIAGNOSIS — Q786 Multiple congenital exostoses: Secondary | ICD-10-CM

## 2015-11-16 DIAGNOSIS — M898X0 Other specified disorders of bone, multiple sites: Secondary | ICD-10-CM

## 2015-11-16 DIAGNOSIS — M79672 Pain in left foot: Secondary | ICD-10-CM

## 2015-11-16 DIAGNOSIS — M2042 Other hammer toe(s) (acquired), left foot: Secondary | ICD-10-CM | POA: Diagnosis not present

## 2015-11-16 NOTE — Patient Instructions (Signed)
Pre-Operative Instructions  Congratulations, you have decided to take an important step to improving your quality of life.  You can be assured that the doctors of [facility] will be with you every step of the way.  1. Plan to be at the surgery center/hospital at least 1 (one) hour prior to your scheduled time unless otherwise directed by the surgical center/hospital staff.  You must have a responsible adult accompany you, remain during the surgery and drive you home.  Make sure you have directions to the surgical center/hospital and know how to get there on time. 2. For hospital based surgery you will need to obtain a history and physical form from your family physician within 1 month prior to the date of surgery- we will give you a form for you primary physician.  3. We make every effort to accommodate the date you request for surgery.  There are however, times where surgery dates or times have to be moved.  We will contact you as soon as possible if a change in schedule is required.   4. No Aspirin/Ibuprofen for one week before surgery.  If you are on aspirin, any non-steroidal anti-inflammatory medications (Mobic, Aleve, Ibuprofen) you should stop taking it 7 days prior to your surgery.  You make take Tylenol  For pain prior to surgery.  5. Medications- If you are taking daily heart and blood pressure medications, seizure, reflux, allergy, asthma, anxiety, pain or diabetes medications, make sure the surgery center/hospital is aware before the day of surgery so they may notify you which medications to take or avoid the day of surgery. 6. No food or drink after midnight the night before surgery unless directed otherwise by surgical center/hospital staff. 7. No alcoholic beverages 24 hours prior to surgery.  No smoking 24 hours prior to or 24 hours after surgery. 8. Wear loose pants or shorts- loose enough to fit over bandages, boots, and casts. 9. No slip on shoes, sneakers are best. 10. Bring  your boot with you to the surgery center/hospital.  Also bring crutches or a walker if your physician has prescribed it for you.  If you do not have this equipment, it will be provided for you after surgery. 11. If you have not been contracted by the surgery center/hospital by the day before your surgery, call to confirm the date and time of your surgery. 12. Leave-time from work may vary depending on the type of surgery you have.  Appropriate arrangements should be made prior to surgery with your employer. 13. Prescriptions will be provided immediately following surgery by your doctor.  Have these filled as soon as possible after surgery and take the medication as directed. 14. Remove nail polish on the operative foot. 15. Wash the night before surgery.  The night before surgery wash the foot and leg well with the antibacterial soap provided and water paying special attention to beneath the toenails and in between the toes.  Rinse thoroughly with water and dry well with a towel.  Perform this wash unless told not to do so by your physician.  Enclosed: 1 Ice pack (please put in freezer the night before surgery)   1 Hibiclens skin cleaner   Pre-op Instructions  If you have any questions regarding the instructions, do not hesitate to call our office.  [facility]: [address] [place], [place] [address] [phone]  [facility]: [address] [place], [place] [address] [phone]  [facility]: [address]  [place], [place] [address] [phone]  Dr. Richard   Tuchman DPM, Dr. Norman Regal DPM Dr. Richard Sikora DPM, Dr. M. Todd Hyatt DPM, Dr. Kathryn [place] DPM 

## 2015-11-16 NOTE — Progress Notes (Signed)
Subjective:     Patient ID: Abigail Foley, female   DOB: 1961/03/25, 55 y.o.   MRN: [id number]  HPI patient states I get a lot of pain between the fourth and fifth toes of my left foot and it's been going on now for several years and I've tried to trim and pad it without significant relief   Review of Systems  All other systems reviewed and are negative.      Objective:   Physical Exam  Constitutional: She is oriented to person, place, and time.  Cardiovascular: Intact distal pulses.   Musculoskeletal: Normal range of motion.  Neurological: She is oriented to person, place, and time.  Skin: Skin is warm.  Nursing note and vitals reviewed.  neurovascular status found to be intact with muscle strength adequate and range of motion within normal limits. 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 At this point we discussed treatment options and due to long-standing nature condition and failure to respond to previous trimming padding it's been recommended for arthroplasty and exostectomy. Patient wants surgery and at this time I allowed her to read consent form for correction reviewing with her all possible complications including infection delayed healing reoccurrence and everything as outlined and consent form. I did explain to her the told recovery for this will take 6 months to one year with certain types shoes and that she needs to be patient and she understands wants surgery and signs consent form. Encouraged to call with any questions prior to procedure and we'll schedule at her convenience   X-ray report left indicated rotated fifth toe  pressing against the fourth toe with enlargement at a proximal phalanx fourth toe

## 2015-11-16 NOTE — Progress Notes (Signed)
   Subjective:    Patient ID: Abigail Foley, female    DOB: 05-27-1961, 55 y.o.   MRN: [id number]  HPI Patient presents with callouses on their Left foot; 4th toe-lateral; 5th toe-medial; x2 yrs.   Review of Systems  HENT: Positive for tinnitus.   All other systems reviewed and are negative.      Objective:   Physical Exam        Assessment & Plan:

## 2016-01-03 DIAGNOSIS — H40013 Open angle with borderline findings, low risk, bilateral: Secondary | ICD-10-CM | POA: Diagnosis not present

## 2016-01-03 DIAGNOSIS — G253 Myoclonus: Secondary | ICD-10-CM | POA: Diagnosis not present

## 2016-01-06 ENCOUNTER — Encounter: Payer: Self-pay | Admitting: Physician Assistant

## 2016-01-06 DIAGNOSIS — H409 Unspecified glaucoma: Secondary | ICD-10-CM | POA: Insufficient documentation

## 2016-01-10 ENCOUNTER — Encounter: Payer: Self-pay | Admitting: Family Medicine

## 2016-01-10 DIAGNOSIS — H9319 Tinnitus, unspecified ear: Secondary | ICD-10-CM

## 2016-02-13 DIAGNOSIS — Z6829 Body mass index (BMI) 29.0-29.9, adult: Secondary | ICD-10-CM | POA: Diagnosis not present

## 2016-02-13 DIAGNOSIS — Z130 Encounter for screening for diseases of the blood and blood-forming organs and certain disorders involving the immune mechanism: Secondary | ICD-10-CM | POA: Diagnosis not present

## 2016-02-13 DIAGNOSIS — Z01419 Encounter for gynecological examination (general) (routine) without abnormal findings: Secondary | ICD-10-CM | POA: Diagnosis not present

## 2016-07-17 DIAGNOSIS — H25013 Cortical age-related cataract, bilateral: Secondary | ICD-10-CM | POA: Diagnosis not present

## 2016-07-17 DIAGNOSIS — H2513 Age-related nuclear cataract, bilateral: Secondary | ICD-10-CM | POA: Diagnosis not present

## 2016-07-17 DIAGNOSIS — H43393 Other vitreous opacities, bilateral: Secondary | ICD-10-CM | POA: Diagnosis not present

## 2016-07-17 DIAGNOSIS — H40013 Open angle with borderline findings, low risk, bilateral: Secondary | ICD-10-CM | POA: Diagnosis not present

## 2016-08-20 ENCOUNTER — Other Ambulatory Visit: Payer: Self-pay | Admitting: Obstetrics & Gynecology

## 2016-08-20 DIAGNOSIS — Z1231 Encounter for screening mammogram for malignant neoplasm of breast: Secondary | ICD-10-CM

## 2016-08-29 ENCOUNTER — Encounter: Payer: Self-pay | Admitting: Family Medicine

## 2016-08-29 ENCOUNTER — Ambulatory Visit (INDEPENDENT_AMBULATORY_CARE_PROVIDER_SITE_OTHER): Payer: 59 | Admitting: Family Medicine

## 2016-08-29 ENCOUNTER — Other Ambulatory Visit: Payer: Self-pay | Admitting: Family Medicine

## 2016-08-29 VITALS — BP 108/60 | HR 80 | Temp 98.3°F | Resp 16 | Ht 65.5 in | Wt 169.8 lb

## 2016-08-29 DIAGNOSIS — Z0000 Encounter for general adult medical examination without abnormal findings: Secondary | ICD-10-CM | POA: Diagnosis not present

## 2016-08-29 DIAGNOSIS — R6889 Other general symptoms and signs: Secondary | ICD-10-CM | POA: Diagnosis not present

## 2016-08-29 DIAGNOSIS — N644 Mastodynia: Secondary | ICD-10-CM | POA: Diagnosis not present

## 2016-08-29 DIAGNOSIS — J301 Allergic rhinitis due to pollen: Secondary | ICD-10-CM

## 2016-08-29 LAB — LIPID PANEL
CHOLESTEROL: 214 mg/dL — AB (ref <200)
HDL: 43 mg/dL — AB (ref >50)
LDL CALC: 139 mg/dL — AB (ref <100)
TRIGLYCERIDES: 161 mg/dL — AB (ref <150)
Total CHOL/HDL Ratio: 5 Ratio — ABNORMAL HIGH (ref <5.0)
VLDL: 32 mg/dL — ABNORMAL HIGH (ref <30)

## 2016-08-29 LAB — POCT URINALYSIS DIP (MANUAL ENTRY)
BILIRUBIN UA: NEGATIVE
GLUCOSE UA: NEGATIVE
Ketones, POC UA: NEGATIVE
NITRITE UA: NEGATIVE
Protein Ur, POC: NEGATIVE
Spec Grav, UA: 1.01
UROBILINOGEN UA: 0.2
pH, UA: 7

## 2016-08-29 LAB — CBC
HEMATOCRIT: 36.9 % (ref 35.0–45.0)
HEMOGLOBIN: 12.3 g/dL (ref 11.7–15.5)
MCH: 31.5 pg (ref 27.0–33.0)
MCHC: 33.3 g/dL (ref 32.0–36.0)
MCV: 94.4 fL (ref 80.0–100.0)
MPV: 9.9 fL (ref 7.5–12.5)
Platelets: 270 10*3/uL (ref 140–400)
RBC: 3.91 MIL/uL (ref 3.80–5.10)
RDW: 12.5 % (ref 11.0–15.0)
WBC: 4.9 10*3/uL (ref 3.8–10.8)

## 2016-08-29 LAB — BASIC METABOLIC PANEL
BUN: 11 mg/dL (ref 7–25)
CALCIUM: 9.3 mg/dL (ref 8.6–10.4)
CHLORIDE: 106 mmol/L (ref 98–110)
CO2: 28 mmol/L (ref 20–31)
Creat: 0.76 mg/dL (ref 0.50–1.05)
Glucose, Bld: 95 mg/dL (ref 65–99)
Potassium: 4.4 mmol/L (ref 3.5–5.3)
SODIUM: 140 mmol/L (ref 135–146)

## 2016-08-29 LAB — TSH: TSH: 1.61 mIU/L

## 2016-08-29 LAB — HEMOGLOBIN A1C
Hgb A1c MFr Bld: 5.3 % (ref <5.7)
Mean Plasma Glucose: 105 mg/dL

## 2016-08-29 MED ORDER — FLUTICASONE PROPIONATE 50 MCG/ACT NA SUSP: 2.0000 | Freq: Every day | NASAL | 6 refills | Status: DC

## 2016-08-29 MED ORDER — SIMVASTATIN 20 MG PO TABS: 20.0000 mg | ORAL_TABLET | Freq: Every day | ORAL | 1 refills | Status: DC

## 2016-08-29 MED ORDER — MOMETASONE FUROATE 50 MCG/ACT NA SUSP: 2.0000 | Freq: Every day | NASAL | 12 refills | Status: DC

## 2016-08-29 MED FILL — MOMETASONE FUROATE 50 MCG S: 50 | 30 days supply | Qty: 17 | Fill #0

## 2016-08-29 NOTE — Progress Notes (Signed)
Chief Complaint  Patient presents with  . Annual Exam    no pap, had in 12/2015    Subjective:  Abigail Foley is a 55 y.o. female here for a health maintenance visit.  Patient is established pt  Patient Active Problem List   Diagnosis Date Noted  . Glaucoma 01/06/2016  . Varicose veins of lower extremities with other complications 0000000  . Pain in limb 08/06/2013  . UPPER RESPIRATORY INFECTION 08/31/2008  . ALLERGIC RHINITIS 08/31/2008  . 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 Anemia   . Hot flashes     Past Surgical History:  Procedure Laterality Date  . CESAREAN SECTION    . DILATION AND CURETTAGE OF UTERUS    . TUBAL LIGATION       Outpatient Medications Prior to Visit  Medication Sig Dispense Refill  . Cholecalciferol (VITAMIN D3) 2000 UNITS capsule Take 2,000 Units by mouth daily.    . cyanocobalamin 1000 MCG tablet Take 100 mcg by mouth daily. Pt taking 2500 mcg every day    . folic acid (FOLVITE) A999333 MCG tablet Take 400 mcg by mouth daily.    Marland Kitchen loratadine (CLARITIN) 10 MG tablet Take 10 mg by mouth daily.    . Zinc 50 MG TABS Take by mouth daily.     No facility-administered medications prior to visit.     Allergies  Allergen Reactions  . Cefaclor     REACTION: anaphalaxis  . Sulfa Antibiotics Hives     Family History  Problem Relation Age of Onset  . Hypertension Mother   . Diabetes Father   . Hyperlipidemia Father   . Hyperlipidemia Sister   . Cancer Brother   . Hyperlipidemia Brother   . Hypertension Brother      Health Habits: Dental Exam: up to date Eye Exam: up to date Exercise: 7 times/week on average Current exercise activities: walking/running Diet:   Social History   Social History  . Marital status: Married    Spouse name: N/A  . Number of children: N/A  . Years of education: N/A   Occupational History  . Not on file.   Social History Main Topics  .  Smoking status: Former Smoker    Types: Cigarettes    Quit date: 10/01/2005  . Smokeless tobacco: Never Used  . Alcohol use Yes     Comment: occasional  . Drug use: No  . Sexual activity: Not on file   Other Topics Concern  . Not on file   Social History Narrative  . No narrative on file   History  Alcohol Use  . Yes    Comment: occasional   History  Smoking Status  . Former Smoker  . Types: Cigarettes  . Quit date: 10/01/2005  Smokeless Tobacco  . Never Used   History  Drug Use No    GYN: Sexual Health Menstrual status: regular menses LMP: No LMP recorded. Patient is postmenopausal. Last pap smear: see HM section History of abnormal pap smears: none Sexually active:  Female partner Current contraception: none, postmenopausal  Health Maintenance: See under health Maintenance activity for review of completion dates as well. Immunization History  Administered Date(s) Administered  . Influenza-Unspecified 07/01/2016  . Tdap 10/02/2007      Depression Screen-PHQ2/9 Depression screen [facility] 2/9 08/29/2016 09/14/2015 12/21/2013  Decreased Interest 0 0 0  Down, Depressed, Hopeless 0 0 0  PHQ - 2 Score 0 0  0      Depression Severity and Treatment Recommendations:  0-4= None  5-9= Mild / Treatment: Support, educate to call if worse; return in one month  10-14= Moderate / Treatment: Support, watchful waiting; Antidepressant or Psycotherapy  15-19= Moderately severe / Treatment: Antidepressant OR Psychotherapy  >= 20 = Major depression, severe / Antidepressant AND Psychotherapy    Review of Systems   Review of Systems  Constitutional: Negative for chills and fever.  HENT: Positive for sinus pain and tinnitus. Negative for congestion, ear discharge, ear pain, nosebleeds and sore throat.   Eyes: Positive for redness. Negative for blurred vision, double vision, photophobia, pain and discharge.  Respiratory: Negative for cough, shortness of breath, wheezing and  stridor.   Cardiovascular: Negative for chest pain, palpitations, orthopnea and leg swelling.  Gastrointestinal: Negative for abdominal pain, diarrhea, nausea and vomiting.  Genitourinary: Negative for dysuria, flank pain, frequency, hematuria and urgency.  Musculoskeletal: Negative for back pain, joint pain, myalgias and neck pain.  Skin: Negative for itching and rash.  Neurological: Negative for dizziness, tingling and headaches.  Endo/Heme/Allergies: Positive for environmental allergies.  Psychiatric/Behavioral: Negative for depression. The patient is not nervous/anxious and does not have insomnia.     See HPI for ROS as well.    Objective:   Vitals:   08/29/16 0933  BP: 108/60  Pulse: 80  Resp: 16  Temp: 98.3 F (36.8 C)  TempSrc: Oral  SpO2: 99%  Weight: 169 lb 12.8 oz (77 kg)  Height: 5' 5.5" (1.664 m)    Body mass index is 27.83 kg/m.  Physical Exam  Constitutional: She is oriented to person, place, and time. She appears well-developed and well-nourished.  HENT:  Head: Normocephalic and atraumatic.  Right Ear: External ear normal.  Left Ear: External ear normal.  Nose: Nose normal.  Mouth/Throat: Oropharynx is clear and moist.  Eyes: Conjunctivae and EOM are normal. Pupils are equal, round, and reactive to light.  Neck: Normal range of motion. Neck supple. No thyromegaly present.  Cardiovascular: Normal rate, regular rhythm, normal heart sounds and intact distal pulses.   No murmur heard. Pulmonary/Chest: Effort normal and breath sounds normal. No respiratory distress. She has no wheezes. Right breast exhibits no inverted nipple, no mass, no nipple discharge, no skin change and no tenderness. Left breast exhibits no inverted nipple, no mass, no nipple discharge, no skin change and no tenderness. Breasts are symmetrical.    Abdominal: Soft. Bowel sounds are normal. She exhibits no distension and no mass. There is no tenderness. There is no rebound and no guarding.    Musculoskeletal: Normal range of motion. She exhibits no edema.  Lymphadenopathy:    She has no cervical adenopathy.  Neurological: She is alert and oriented to person, place, and time. She has normal reflexes.  Skin: Skin is warm. No rash noted. No erythema.  Psychiatric: She has a normal mood and affect. Her behavior is normal. Judgment and thought content normal.       Assessment/Plan:   Patient was seen for a health maintenance exam.  Counseled the patient on health maintenance issues. Reviewed her health mainteance schedule and ordered appropriate tests (see orders.) Counseled on regular exercise and weight management. Recommend regular eye exams and dental cleaning.   The following issues were addressed today for health maintenance:   There are no diagnoses linked to this encounter.  Lurlene was seen today for annual exam.  Diagnoses and all orders for this visit:  Annual physical exam -  Basic metabolic panel -     TSH -     Lipid panel -     Hemoglobin A1c -     CBC -     POCT urinalysis dipstick  Heat intolerance -     TSH  Breast tenderness- pt has mammogram scheduled in 1-2 weeks for routine mammogram Advised pt to stop wearing underwire bras  Allergic Rhinitis Try nasonex and continue loratidine -     mometasone (NASONEX) 50 MCG/ACT nasal spray; Place 2 sprays into the nose daily.    No Follow-up on file.

## 2016-08-29 NOTE — Progress Notes (Signed)
Started new medication simvastatin

## 2016-08-29 NOTE — Patient Instructions (Signed)
     IF you received an x-ray today, you will receive an invoice from [facility]. Please contact [facility] at [phone] with questions or concerns regarding your invoice.   IF you received labwork today, you will receive an invoice from [facility]. Please contact [facility] at [phone] with questions or concerns regarding your invoice.   Our billing staff will not be able to assist you with questions regarding bills from these companies.  You will be contacted with the lab results as soon as they are available. The fastest way to get your results is to activate your My Chart account. Instructions are located on the last page of this paperwork. If you have not heard from us regarding the results in 2 weeks, please contact this office.      

## 2016-09-03 ENCOUNTER — Encounter: Payer: Self-pay | Admitting: Family Medicine

## 2016-09-05 ENCOUNTER — Encounter: Payer: 59 | Admitting: Family Medicine

## 2016-09-21 ENCOUNTER — Ambulatory Visit: Payer: 59

## 2016-09-27 ENCOUNTER — Ambulatory Visit
Admission: RE | Admit: 2016-09-27 | Discharge: 2016-09-27 | Disposition: A | Discharge status: Home / Self Care | Payer: 59 | Source: Ambulatory Visit | Attending: Obstetrics & Gynecology | Admitting: Obstetrics & Gynecology

## 2016-09-27 DIAGNOSIS — Z1231 Encounter for screening mammogram for malignant neoplasm of breast: Secondary | ICD-10-CM

## 2017-04-16 ENCOUNTER — Telehealth: Payer: 59 | Admitting: Family

## 2017-04-16 DIAGNOSIS — N390 Urinary tract infection, site not specified: Secondary | ICD-10-CM

## 2017-04-16 DIAGNOSIS — A499 Bacterial infection, unspecified: Secondary | ICD-10-CM | POA: Diagnosis not present

## 2017-04-16 MED ORDER — NITROFURANTOIN MONOHYD MACRO 100 MG PO CAPS: 100.0000 mg | ORAL_CAPSULE | Freq: Two times a day (BID) | ORAL | 0 refills | Status: DC

## 2017-04-16 MED FILL — NITROFURANTOIN MONO-MCR 100: 100 | 5 days supply | Qty: 10 | Fill #0

## 2017-04-16 NOTE — Progress Notes (Signed)

## 2017-07-24 ENCOUNTER — Other Ambulatory Visit: Payer: Self-pay | Admitting: Family Medicine

## 2017-07-24 DIAGNOSIS — Z1231 Encounter for screening mammogram for malignant neoplasm of breast: Secondary | ICD-10-CM

## 2017-09-18 ENCOUNTER — Encounter: Payer: Self-pay | Admitting: Family Medicine

## 2017-09-18 ENCOUNTER — Ambulatory Visit (INDEPENDENT_AMBULATORY_CARE_PROVIDER_SITE_OTHER): Payer: 59 | Admitting: Family Medicine

## 2017-09-18 ENCOUNTER — Other Ambulatory Visit: Payer: Self-pay

## 2017-09-18 VITALS — BP 110/62 | HR 72 | Temp 98.0°F | Resp 16 | Ht 65.75 in | Wt 170.4 lb

## 2017-09-18 DIAGNOSIS — N898 Other specified noninflammatory disorders of vagina: Secondary | ICD-10-CM

## 2017-09-18 DIAGNOSIS — Z0000 Encounter for general adult medical examination without abnormal findings: Secondary | ICD-10-CM | POA: Diagnosis not present

## 2017-09-18 DIAGNOSIS — Z833 Family history of diabetes mellitus: Secondary | ICD-10-CM

## 2017-09-18 DIAGNOSIS — Z13220 Encounter for screening for lipoid disorders: Secondary | ICD-10-CM

## 2017-09-18 DIAGNOSIS — Z124 Encounter for screening for malignant neoplasm of cervix: Secondary | ICD-10-CM

## 2017-09-18 DIAGNOSIS — H2513 Age-related nuclear cataract, bilateral: Secondary | ICD-10-CM | POA: Diagnosis not present

## 2017-09-18 DIAGNOSIS — Z131 Encounter for screening for diabetes mellitus: Secondary | ICD-10-CM

## 2017-09-18 DIAGNOSIS — H524 Presbyopia: Secondary | ICD-10-CM | POA: Diagnosis not present

## 2017-09-18 DIAGNOSIS — H43393 Other vitreous opacities, bilateral: Secondary | ICD-10-CM | POA: Diagnosis not present

## 2017-09-18 DIAGNOSIS — H25013 Cortical age-related cataract, bilateral: Secondary | ICD-10-CM | POA: Diagnosis not present

## 2017-09-18 DIAGNOSIS — H40013 Open angle with borderline findings, low risk, bilateral: Secondary | ICD-10-CM | POA: Diagnosis not present

## 2017-09-18 LAB — POCT WET + KOH PREP
Trich by wet prep: ABSENT
YEAST BY KOH: ABSENT
YEAST BY WET PREP: ABSENT

## 2017-09-18 NOTE — Progress Notes (Deleted)
  No chief complaint on file.   HPI  4 review of systems  Past Medical History:  Diagnosis Date  . Anemia   . Hot flashes     Current Outpatient Medications  Medication Sig Dispense Refill  . BIOTIN 5000 PO Take by mouth.    . Cholecalciferol (VITAMIN D3) 2000 UNITS capsule Take 2,000 Units by mouth daily.    . cyanocobalamin 1000 MCG tablet Take 100 mcg by mouth daily. Pt taking 2500 mcg every day    . folic acid (FOLVITE) 500 MCG tablet Take 400 mcg by mouth daily.    Marland Kitchen loratadine (CLARITIN) 10 MG tablet Take 10 mg by mouth daily.    . mometasone (NASONEX) 50 MCG/ACT nasal spray Place 2 sprays into the nose daily. 17 g 12  . nitrofurantoin, macrocrystal-monohydrate, (MACROBID) 100 MG capsule Take 1 capsule (100 mg total) by mouth 2 (two) times daily. 10 capsule 0  . Probiotic Product (PROBIOTIC DAILY PO) Take by mouth.    . simvastatin (ZOCOR) 20 MG tablet Take 1 tablet (20 mg total) by mouth at bedtime. 90 tablet 1  . Zinc 50 MG TABS Take by mouth daily.     No current facility-administered medications for this visit.     Allergies:  Allergies  Allergen Reactions  . Cefaclor     REACTION: anaphalaxis  . Sulfa Antibiotics Hives    Past Surgical History:  Procedure Laterality Date  . CESAREAN SECTION    . DILATION AND CURETTAGE OF UTERUS    . TUBAL LIGATION      Social History   Socioeconomic History  . Marital status: Married    Spouse name: Not on file  . Number of children: Not on file  . Years of education: Not on file  . Highest education level: Not on file  Social Needs  . Financial resource strain: Not on file  . Food insecurity - worry: Not on file  . Food insecurity - inability: Not on file  . Transportation needs - medical: Not on file  . Transportation needs - non-medical: Not on file  Occupational History  . Not on file  Tobacco Use  . Smoking status: Former Smoker    Types: Cigarettes    Last attempt to quit: 10/01/2005    Years since  quitting: 11.9  . Smokeless tobacco: Never Used  Substance and Sexual Activity  . Alcohol use: Yes    Comment: occasional  . Drug use: No  . Sexual activity: Not on file  Other Topics Concern  . Not on file  Social History Narrative  . Not on file    Family History  Problem Relation Age of Onset  . Hypertension Mother   . Diabetes Father   . Hyperlipidemia Father   . Hyperlipidemia Sister   . Cancer Brother   . Hyperlipidemia Brother   . Hypertension Brother      ROS Review of Systems See HPI Constitution: No fevers or chills No malaise No diaphoresis Skin: No rash or itching Eyes: no blurry vision, no double vision GU: no dysuria or hematuria Neuro: no dizziness or headaches * all others reviewed and negative   Objective: There were no vitals filed for this visit.  Physical Exam  Assessment and Plan There are no diagnoses linked to this encounter.   Abigail Foley

## 2017-09-18 NOTE — Progress Notes (Signed)
Chief Complaint  Patient presents with  . Annual Exam    with pap, joint pain left side in buttocks area    Subjective:  Abigail Foley is a 56 y.o. female here for a health maintenance visit.  Patient is established pt  Patient Active Problem List   Diagnosis Date Noted  . Glaucoma 01/06/2016  . Varicose veins of lower extremities with other complications 81/19/1478  . Pain in limb 08/06/2013  . UPPER RESPIRATORY INFECTION 08/31/2008  . ALLERGIC RHINITIS 08/31/2008  . 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 Anemia   . Hot flashes     Past Surgical History:  Procedure Laterality Date  . CESAREAN SECTION    . DILATION AND CURETTAGE OF UTERUS    . TUBAL LIGATION       Outpatient Medications Prior to Visit  Medication Sig Dispense Refill  . Biotin 10000 MCG TABS Take by mouth.    Marland Kitchen BIOTIN 5000 PO Take by mouth.    . Cholecalciferol (VITAMIN D3) 2000 UNITS capsule Take 5,000 Units by mouth daily.     . cyanocobalamin 1000 MCG tablet Take 100 mcg by mouth daily. Pt taking 2500 mcg every day    . folic acid (FOLVITE) 295 MCG tablet Take 800 mcg by mouth daily.     Marland Kitchen loratadine (CLARITIN) 10 MG tablet Take 10 mg by mouth daily.    . Probiotic Product (PROBIOTIC DAILY PO) Take by mouth.    . valACYclovir (VALTREX) 500 MG tablet Take 500 mg by mouth 2 (two) times daily.    . Zinc 50 MG TABS Take by mouth daily.    . mometasone (NASONEX) 50 MCG/ACT nasal spray Place 2 sprays into the nose daily. (Patient not taking: Reported on 09/18/2017) 17 g 12  . nitrofurantoin, macrocrystal-monohydrate, (MACROBID) 100 MG capsule Take 1 capsule (100 mg total) by mouth 2 (two) times daily. (Patient not taking: Reported on 09/18/2017) 10 capsule 0  . simvastatin (ZOCOR) 20 MG tablet Take 1 tablet (20 mg total) by mouth at bedtime. (Patient not taking: Reported on 09/18/2017) 90 tablet 1   No facility-administered medications prior to  visit.     Allergies  Allergen Reactions  . Cefaclor     REACTION: anaphalaxis  . Sulfa Antibiotics Hives     Family History  Problem Relation Age of Onset  . Hypertension Mother   . Diabetes Father   . Hyperlipidemia Father   . Hyperlipidemia Sister   . Cancer Brother   . Hyperlipidemia Brother   . Hypertension Brother      Health Habits: Dental Exam: up to date Eye Exam: up to date Exercise: 2-5 times/week on average Current exercise activities: elliptical/ weight training  Diet: balanced  Social History   Socioeconomic History  . Marital status: Married    Spouse name: Not on file  . Number of children: Not on file  . Years of education: Not on file  . Highest education level: Not on file  Social Needs  . Financial resource strain: Not on file  . Food insecurity - worry: Not on file  . Food insecurity - inability: Not on file  . Transportation needs - medical: Not on file  . Transportation needs - non-medical: Not on file  Occupational History  . Not on file  Tobacco Use  . Smoking status: Former Smoker    Types: Cigarettes    Last attempt to quit: 10/01/2005  Years since quitting: 11.9  . Smokeless tobacco: Never Used  Substance and Sexual Activity  . Alcohol use: Yes    Comment: occasional  . Drug use: No  . Sexual activity: Not on file  Other Topics Concern  . Not on file  Social History Narrative  . Not on file   Social History   Substance and Sexual Activity  Alcohol Use Yes   Comment: occasional   Social History   Tobacco Use  Smoking Status Former Smoker  . Types: Cigarettes  . Last attempt to quit: 10/01/2005  . Years since quitting: 11.9  Smokeless Tobacco Never Used   Social History   Substance and Sexual Activity  Drug Use No    GYN: Sexual Health Menstrual status: regular menses LMP: No LMP recorded. Patient is postmenopausal. Last pap smear: see HM section History of abnormal pap smears:  Sexually active:  with female  partner Current contraception: none  Health Maintenance: See under health Maintenance activity for review of completion dates as well. Immunization History  Administered Date(s) Administered  . Influenza-Unspecified 07/01/2016  . Tdap 10/02/2007      Depression Screen-PHQ2/9 Depression screen [facility] 2/9 09/18/2017 08/29/2016 09/14/2015 12/21/2013  Decreased Interest 0 0 0 0  Down, Depressed, Hopeless 0 0 0 0  PHQ - 2 Score 0 0 0 0      Depression Severity and Treatment Recommendations:  0-4= None  5-9= Mild / Treatment: Support, educate to call if worse; return in one month  10-14= Moderate / Treatment: Support, watchful waiting; Antidepressant or Psycotherapy  15-19= Moderately severe / Treatment: Antidepressant OR Psychotherapy  >= 20 = Major depression, severe / Antidepressant AND Psychotherapy    Review of Systems   Review of Systems  Constitutional: Negative for chills, fever and weight loss.  HENT: Negative for ear discharge, ear pain, hearing loss, nosebleeds and tinnitus.   Eyes: Negative for blurred vision and double vision.  Respiratory: Negative for cough, shortness of breath and wheezing.   Cardiovascular: Negative for chest pain and palpitations.  Gastrointestinal: Positive for constipation. Negative for abdominal pain, diarrhea, nausea and vomiting.  Musculoskeletal: Positive for joint pain. Negative for back pain, myalgias and neck pain.  Skin: Negative for itching and rash.  Neurological: Negative for dizziness, tingling and headaches.  Psychiatric/Behavioral: Negative for depression, hallucinations, substance abuse and suicidal ideas. The patient is not nervous/anxious.     See HPI for ROS as well.    Objective:   Vitals:   09/18/17 0816  BP: 110/62  Pulse: 72  Resp: 16  Temp: 98 F (36.7 C)  TempSrc: Oral  SpO2: 98%  Weight: 170 lb 6.4 oz (77.3 kg)  Height: 5' 5.75" (1.67 m)    Body mass index is 27.71 kg/m.  Physical Exam  BP 110/62  (BP Location: Left Arm, Patient Position: Sitting, Cuff Size: Normal)   Pulse 72   Temp 98 F (36.7 C) (Oral)   Resp 16   Ht 5' 5.75" (1.67 m)   Wt 170 lb 6.4 oz (77.3 kg)   SpO2 98%   BMI 27.71 kg/m   General Appearance:    Alert, cooperative, no distress, appears stated age  Head:    Normocephalic, without obvious abnormality, atraumatic  Eyes:    PERRL, conjunctiva/corneas clear, EOM's intact, fundi    benign, both eyes  Ears:    Normal TM's and external ear canals, both ears  Nose:   Nares normal, septum midline, mucosa normal, no drainage  or sinus tenderness  Throat:   Lips, mucosa, and tongue normal; teeth and gums normal  Neck:   Supple, symmetrical, trachea midline, no adenopathy;    thyroid:  no enlargement/tenderness/nodules; no carotid   bruit or JVD  Back:     Symmetric, no curvature, ROM normal, no CVA tenderness  Lungs:     Clear to auscultation bilaterally, respirations unlabored  Chest Wall:    No tenderness or deformity   Heart:    Regular rate and rhythm, S1 and S2 normal, no murmur, rub   or gallop  Breast Exam:    Chaperone present: No tenderness, masses, or nipple abnormality  Abdomen:     Soft, non-tender, bowel sounds active all four quadrants,    no masses, no organomegaly  Genitalia:    Chaperone present: Normal female without lesion, + white vaginal discharge, no tenderness, pap performed  Extremities:   Extremities normal, atraumatic, no cyanosis or edema  Pulses:   2+ and symmetric all extremities  Skin:   Skin color, texture, turgor normal, no rashes or lesions  Lymph nodes:   Cervical, supraclavicular, and axillary nodes normal  Neurologic:   CNII-XII intact, normal strength, sensation and reflexes    throughout       Assessment/Plan:   Patient was seen for a health maintenance exam.  Counseled the patient on health maintenance issues. Reviewed her health mainteance schedule and ordered appropriate tests (see orders.) Counseled on regular  exercise and weight management. Recommend regular eye exams and dental cleaning.   The following issues were addressed today for health maintenance:   Abigail Foley was seen today for annual exam.  Diagnoses and all orders for this visit:  Encounter for Papanicolaou smear for cervical cancer screening- discussed pap guidelines Pt has previous history of abnormal pap  -     Pap IG and HPV (high risk) DNA detection  Annual physical exam- age appropriate screenings reviewed  Family history of diabetes mellitus in father -     Hemoglobin A1c  Screening for diabetes mellitus -     Hemoglobin A1c  Vaginal discharge- no bv or yeast -     POCT Wet + KOH Prep  Screening, lipid -     Basic metabolic panel -     Lipid panel  Other orders -     Cancel: Pap IG, CT/NG NAA, and HPV (high risk) [facility]    No Follow-up on file.    Body mass index is 27.71 kg/m.:  Discussed the patient's BMI with patient. The BMI body mass index is 27.71 kg/m.     Future Appointments  Date Time Provider [place]  09/30/2017 12:30 PM [facility] 2 [facility]    Patient Instructions       IF you received an x-ray today, you will receive an invoice from [facility]. Please contact [facility] at [phone] with questions or concerns regarding your invoice.   IF you received labwork today, you will receive an invoice from [facility]. Please contact [facility] at [phone] with questions or concerns regarding your invoice.   Our billing staff will not be able to assist you with questions regarding bills from these companies.  You will be contacted with the lab results as soon as they are available. The fastest way to get your results is to activate your My Chart account. Instructions are located on the last page of this paperwork. If you have not heard from [place] regarding the results in 2 weeks, please  contact this office.     Cervical Cancer The cervix is  the opening and bottom part of the uterus between the vagina and the uterus. Cervical cancer is a fairly common cancer. It occurs most often in women between the ages of 9 years and 65 years. Cells of the cervix act very much like skin cells. These cells are exposed to toxins, viruses, and bacteria that may cause abnormal changes. There are two kinds of cancers of the cervix:  Squamous cell carcinoma. This type of cancer starts in the flat or scale-like cells that line the cervix. Squamous cell carcinoma can develop from a sexually transmitted infection caused by the human papillomavirus (HPV).  Adenocarcinoma. This type of cervical cancer starts in glandular cells that line the cervix.  What increases the risk? The risk of getting cancer of the cervix is related to your lifestyle, sexual history, health, and immune system. Risks for cervical cancer include:  Having a sexually transmitted viral infection. These include: ? Chlamydia. ? Herpes. ? HPV.  Becoming sexually active before age 47 years.  Having more than one sexual partner or having sex with someone who has more than one sexual partner.  Not using condoms with sexual partners.  Having had cancer of the vagina or vulva.  Having a sexual partner who has or had cancer of the penis or who has had a sexual partner with abnormal cervical cells (dysplasia) or cervical cancer.  Using oral contraceptives (also called birth control pills).  Smoking.  Having a weakened immune system. For example, human immunodeficiency virus (HIV) or other immune deficiency disorders.  Being the daughter of a woman who took diethylstilbestrol (DES) during pregnancy.  Having a sister or mother who has had cancer of the cervix.  Being [place] American, Hispanic, Asian, or a woman from the [place].  A history of dysplasia of the cervix.  What are the signs or symptoms? Symptoms are usually not present in the early stages of cervical  cancer. Once the cancer invades the cervix and surrounding tissues, the woman may have:  Abnormal vaginal bleeding or menstrual bleeding that is longer or heavier than usual.  Bleeding after intercourse, douching, or a Pap test.  Vaginal bleeding following menopause.  Abnormal vaginal discharge.  Pelvic discomfort or pain.  An abnormal Pap test.  Pain during sexual intercourse.  Symptoms of more advanced cervical cancer may include:  Loss of appetite or weight loss.  Tiredness (fatigue).  Back and leg pain.  Inability to control urination or bowel movements.  How is this diagnosed? A pelvic exam and Pap test are done to diagnose the condition. If abnormalities are found during the exam or Pap test, the Pap test may be repeated in 3 months, or your health care provider may do additional tests or procedures, such as:  A colposcopy. This is a procedure that uses a special microscope that allows the health care provider to magnify and closely examine the cells of the cervix, vagina, and vulva.  Cervical biopsies. This is a procedure where small tissue samples are taken from the cervix to be examined under a microscope by a specialist.  A cone biopsy. This is a procedure to test for or remove cancerous tissue.  Other tests may be needed, including:  Cystoscopy.  Proctoscopy or sigmoidoscopy.  Ultrasound.  CT scan.  MRI.  Laparoscopy.  There are different stages of cervical cancer:  Stage 0, carcinoma in situ (CIS)-This first stage of cancer is the last and most  serious stage of dysplasia.  Stage I-This means the tumor is in the uterus and cervix only.  Stage II-This means the tumor has spread to the upper vagina. The cancer has spread beyond the uterus but not to the pelvic walls or lower third of the vagina.  Stage III-This means the tumor has invaded the side wall of the pelvis and the lower third of the vagina. If the tumor blocks the tubes that carry urine to  the bladder (ureters), it may cause urine to back up and the kidneys to swell (hydronephrosis).  Stage IV-This means the tumor has spread to the rectum or bladder. In the later part of this stage, it has also spread to distant organs, like the lungs.  How is this treated? Treatment options can include:  Cone biopsy to remove the cancerous tissue.  Removal of the entire uterus and cervix.  Removal of the uterus, cervix, upper vagina, lymph nodes, and surrounding tissue (modified radical hysterectomy). The ovaries may be left in place or removed.  Medicines to treat cancer.  A combination of surgery, radiation, and chemotherapy.  Biological response modifiers. These are substances that help strengthen your immune system's fight against cancer or infection. They may be used in combination with chemotherapy.  Follow these instructions at home:  Get a gynecology exam and Pap test once every year or as directed by your health care provider.  Get the HPV vaccine.  Do not smoke.  Do not have sexual intercourse until your health care provider says it is okay.  Use a condom every time you have sex. Contact a health care provider if:  You have increased pelvic pain or pressure.  Your are becoming increasingly tired.  You have increased leg or back pain.  You have a fever.  You have abnormal bleeding or discharge.  You lose weight. Get help right away if:  You cannot urinate.  You have blood in your urine.  You have blood or pressure with a bowel movement.  You develop severe back, stomach, or pelvic pain. This information is not intended to replace advice given to you by your health care provider. Make sure you discuss any questions you have with your health care provider. Document Released: 09/17/2005 Document Revised: 02/29/2016 Document Reviewed: 03/11/2013 Elsevier Interactive Patient Education  2017 [employer].

## 2017-09-18 NOTE — Patient Instructions (Addendum)
IF you received an x-ray today, you will receive an invoice from [facility]. Please contact [facility] at [phone] with questions or concerns regarding your invoice.   IF you received labwork today, you will receive an invoice from [facility]. Please contact [facility] at [phone] with questions or concerns regarding your invoice.   Our billing staff will not be able to assist you with questions regarding bills from these companies.  You will be contacted with the lab results as soon as they are available. The fastest way to get your results is to activate your My Chart account. Instructions are located on the last page of this paperwork. If you have not heard from [place] regarding the results in 2 weeks, please contact this office.     Cervical Cancer The cervix is the opening and bottom part of the uterus between the vagina and the uterus. Cervical cancer is a fairly common cancer. It occurs most often in women between the ages of 31 years and 90 years. Cells of the cervix act very much like skin cells. These cells are exposed to toxins, viruses, and bacteria that may cause abnormal changes. There are two kinds of cancers of the cervix:  Squamous cell carcinoma. This type of cancer starts in the flat or scale-like cells that line the cervix. Squamous cell carcinoma can develop from a sexually transmitted infection caused by the human papillomavirus (HPV).  Adenocarcinoma. This type of cervical cancer starts in glandular cells that line the cervix.  What increases the risk? The risk of getting cancer of the cervix is related to your lifestyle, sexual history, health, and immune system. Risks for cervical cancer include:  Having a sexually transmitted viral infection. These include: ? Chlamydia. ? Herpes. ? HPV.  Becoming sexually active before age 73 years.  Having more than one sexual partner or having sex with someone who has more than one sexual  partner.  Not using condoms with sexual partners.  Having had cancer of the vagina or vulva.  Having a sexual partner who has or had cancer of the penis or who has had a sexual partner with abnormal cervical cells (dysplasia) or cervical cancer.  Using oral contraceptives (also called birth control pills).  Smoking.  Having a weakened immune system. For example, human immunodeficiency virus (HIV) or other immune deficiency disorders.  Being the daughter of a woman who took diethylstilbestrol (DES) during pregnancy.  Having a sister or mother who has had cancer of the cervix.  Being [place] American, Hispanic, Asian, or a woman from the [place].  A history of dysplasia of the cervix.  What are the signs or symptoms? Symptoms are usually not present in the early stages of cervical cancer. Once the cancer invades the cervix and surrounding tissues, the woman may have:  Abnormal vaginal bleeding or menstrual bleeding that is longer or heavier than usual.  Bleeding after intercourse, douching, or a Pap test.  Vaginal bleeding following menopause.  Abnormal vaginal discharge.  Pelvic discomfort or pain.  An abnormal Pap test.  Pain during sexual intercourse.  Symptoms of more advanced cervical cancer may include:  Loss of appetite or weight loss.  Tiredness (fatigue).  Back and leg pain.  Inability to control urination or bowel movements.  How is this diagnosed? A pelvic exam and Pap test are done to diagnose the condition. If abnormalities are found during the exam or Pap test, the Pap test may be repeated in 3 months, or your health  care provider may do additional tests or procedures, such as:  A colposcopy. This is a procedure that uses a special microscope that allows the health care provider to magnify and closely examine the cells of the cervix, vagina, and vulva.  Cervical biopsies. This is a procedure where small tissue samples are taken from the cervix  to be examined under a microscope by a specialist.  A cone biopsy. This is a procedure to test for or remove cancerous tissue.  Other tests may be needed, including:  Cystoscopy.  Proctoscopy or sigmoidoscopy.  Ultrasound.  CT scan.  MRI.  Laparoscopy.  There are different stages of cervical cancer:  Stage 0, carcinoma in situ (CIS)-This first stage of cancer is the last and most serious stage of dysplasia.  Stage I-This means the tumor is in the uterus and cervix only.  Stage II-This means the tumor has spread to the upper vagina. The cancer has spread beyond the uterus but not to the pelvic walls or lower third of the vagina.  Stage III-This means the tumor has invaded the side wall of the pelvis and the lower third of the vagina. If the tumor blocks the tubes that carry urine to the bladder (ureters), it may cause urine to back up and the kidneys to swell (hydronephrosis).  Stage IV-This means the tumor has spread to the rectum or bladder. In the later part of this stage, it has also spread to distant organs, like the lungs.  How is this treated? Treatment options can include:  Cone biopsy to remove the cancerous tissue.  Removal of the entire uterus and cervix.  Removal of the uterus, cervix, upper vagina, lymph nodes, and surrounding tissue (modified radical hysterectomy). The ovaries may be left in place or removed.  Medicines to treat cancer.  A combination of surgery, radiation, and chemotherapy.  Biological response modifiers. These are substances that help strengthen your immune system's fight against cancer or infection. They may be used in combination with chemotherapy.  Follow these instructions at home:  Get a gynecology exam and Pap test once every year or as directed by your health care provider.  Get the HPV vaccine.  Do not smoke.  Do not have sexual intercourse until your health care provider says it is okay.  Use a condom every time you have  sex. Contact a health care provider if:  You have increased pelvic pain or pressure.  Your are becoming increasingly tired.  You have increased leg or back pain.  You have a fever.  You have abnormal bleeding or discharge.  You lose weight. Get help right away if:  You cannot urinate.  You have blood in your urine.  You have blood or pressure with a bowel movement.  You develop severe back, stomach, or pelvic pain. This information is not intended to replace advice given to you by your health care provider. Make sure you discuss any questions you have with your health care provider. Document Released: 09/17/2005 Document Revised: 02/29/2016 Document Reviewed: 03/11/2013 Elsevier Interactive Patient Education  2017 [employer].

## 2017-09-19 LAB — HEMOGLOBIN A1C
ESTIMATED AVERAGE GLUCOSE: 111 mg/dL
Hgb A1c MFr Bld: 5.5 % (ref 4.8–5.6)

## 2017-09-19 LAB — BASIC METABOLIC PANEL
BUN / CREAT RATIO: 15 (ref 9–23)
BUN: 10 mg/dL (ref 6–24)
CO2: 23 mmol/L (ref 20–29)
CREATININE: 0.67 mg/dL (ref 0.57–1.00)
Calcium: 9.2 mg/dL (ref 8.7–10.2)
Chloride: 106 mmol/L (ref 96–106)
GFR calc Af Amer: 113 mL/min/{1.73_m2} (ref >59)
GFR, EST NON AFRICAN AMERICAN: 98 mL/min/{1.73_m2} (ref >59)
Glucose: 94 mg/dL (ref 65–99)
Potassium: 4 mmol/L (ref 3.5–5.2)
SODIUM: 143 mmol/L (ref 134–144)

## 2017-09-19 LAB — LIPID PANEL
CHOL/HDL RATIO: 4 ratio (ref 0.0–4.4)
CHOLESTEROL TOTAL: 189 mg/dL (ref 100–199)
HDL: 47 mg/dL (ref >39)
LDL CALC: 119 mg/dL — AB (ref 0–99)
TRIGLYCERIDES: 114 mg/dL (ref 0–149)
VLDL Cholesterol Cal: 23 mg/dL (ref 5–40)

## 2017-09-20 LAB — PAP IG AND HPV HIGH-RISK
HPV, high-risk: NEGATIVE
PAP SMEAR COMMENT: 0

## 2017-09-30 ENCOUNTER — Ambulatory Visit
Admission: RE | Admit: 2017-09-30 | Discharge: 2017-09-30 | Disposition: A | Discharge status: Home / Self Care | Payer: 59 | Source: Ambulatory Visit | Attending: Family Medicine | Admitting: Family Medicine

## 2017-09-30 DIAGNOSIS — Z1231 Encounter for screening mammogram for malignant neoplasm of breast: Secondary | ICD-10-CM | POA: Diagnosis not present

## 2017-10-11 ENCOUNTER — Telehealth: Payer: Self-pay | Admitting: Gastroenterology

## 2017-10-11 NOTE — Telephone Encounter (Signed)
Received colon/path report. Patient states that she is due for another colonoscopy. Patient is requesting to see Dr. Fuller Plan. Records placed on his desk for review.

## 2017-10-14 ENCOUNTER — Encounter: Payer: Self-pay | Admitting: Gastroenterology

## 2017-10-14 NOTE — Telephone Encounter (Signed)
Error

## 2017-10-14 NOTE — Telephone Encounter (Signed)
Dr. Fuller Plan has reviewed records and states proceed to schedule if patient's family history of colon cancer is a 1st degree relative or multiple 2nd degree relatives. Left a message for patient to return my call.

## 2017-10-16 NOTE — Telephone Encounter (Signed)
Dr.Stark reviewed records and noted "Prior colonoscopy in 2011 states family hx of colon cancer. If 1st degree or multiple 2nd degree then due for colonoscopy okay to schedule." Left message for patient to call back and schedule an appointment.

## 2017-10-17 ENCOUNTER — Encounter: Payer: Self-pay | Admitting: Gastroenterology

## 2017-11-28 ENCOUNTER — Other Ambulatory Visit: Payer: Self-pay

## 2017-11-28 ENCOUNTER — Ambulatory Visit (AMBULATORY_SURGERY_CENTER): Payer: Self-pay

## 2017-11-28 VITALS — Ht 65.0 in | Wt 171.6 lb

## 2017-11-28 DIAGNOSIS — Z86010 Personal history of colonic polyps: Secondary | ICD-10-CM

## 2017-11-28 MED ORDER — NA SULFATE-K SULFATE-MG SULF 17.5-3.13-1.6 GM/177ML PO SOLN: 1.0000 | Freq: Once | ORAL | 0 refills | Status: AC

## 2017-11-28 MED FILL — SUPREP BOWEL PREP KIT: 17.5-3.13-1 | 1 days supply | Qty: 354 | Fill #0

## 2017-11-28 NOTE — Progress Notes (Signed)
Denies allergies to eggs or soy products. Denies complication of anesthesia or sedation. Denies use of weight loss medication. Denies use of O2.   Emmi instructions declined.  

## 2017-12-06 ENCOUNTER — Other Ambulatory Visit: Payer: Self-pay

## 2017-12-06 ENCOUNTER — Encounter: Payer: Self-pay | Admitting: Gastroenterology

## 2017-12-06 ENCOUNTER — Ambulatory Visit (AMBULATORY_SURGERY_CENTER): Payer: 59 | Admitting: Gastroenterology

## 2017-12-06 VITALS — BP 98/51 | HR 79 | Temp 98.0°F | Resp 11 | Ht 65.0 in | Wt 171.0 lb

## 2017-12-06 DIAGNOSIS — E669 Obesity, unspecified: Secondary | ICD-10-CM | POA: Diagnosis not present

## 2017-12-06 DIAGNOSIS — D125 Benign neoplasm of sigmoid colon: Secondary | ICD-10-CM

## 2017-12-06 DIAGNOSIS — D123 Benign neoplasm of transverse colon: Secondary | ICD-10-CM | POA: Diagnosis not present

## 2017-12-06 DIAGNOSIS — Z86010 Personal history of colonic polyps: Secondary | ICD-10-CM | POA: Diagnosis not present

## 2017-12-06 DIAGNOSIS — Z800 Family history of malignant neoplasm of digestive organs: Secondary | ICD-10-CM

## 2017-12-06 DIAGNOSIS — Z1211 Encounter for screening for malignant neoplasm of colon: Secondary | ICD-10-CM | POA: Diagnosis present

## 2017-12-06 DIAGNOSIS — K635 Polyp of colon: Secondary | ICD-10-CM | POA: Diagnosis not present

## 2017-12-06 DIAGNOSIS — D126 Benign neoplasm of colon, unspecified: Secondary | ICD-10-CM

## 2017-12-06 MED ORDER — SODIUM CHLORIDE 0.9 % IV SOLN: 500.0000 mL | Freq: Once | INTRAVENOUS | Status: DC

## 2017-12-06 NOTE — Op Note (Signed)
[place] Patient Name: Abigail Foley Procedure Date: 12/06/2017 8:58 AM MRN: [id number] Endoscopist: Ladene Artist , MD Age: 57 Referring MD:  Date of Birth: [birth year] Gender: Female Account #: [id number] Procedure:                Colonoscopy Indications:              Colon cancer screening in patient at increased                            risk: Family history of colorectal cancer in                            multiple 2nd degree relatives Medicines:                Monitored Anesthesia Care Procedure:                Pre-Anesthesia Assessment:                           - Prior to the procedure, a History and Physical                            was performed, and patient medications and                            allergies were reviewed. The patient's tolerance of                            previous anesthesia was also reviewed. The risks                            and benefits of the procedure and the sedation                            options and risks were discussed with the patient.                            All questions were answered, and informed consent                            was obtained. Prior Anticoagulants: The patient has                            taken no previous anticoagulant or antiplatelet                            agents. ASA Grade Assessment: II - A patient with                            mild systemic disease. After reviewing the risks                            and benefits, the patient was deemed in  satisfactory condition to undergo the procedure.                           After obtaining informed consent, the colonoscope                            was passed under direct vision. Throughout the                            procedure, the patient's blood pressure, pulse, and                            oxygen saturations were monitored continuously. The                            Model PCF-[id number])  scope was introduced                            through the anus and advanced to the the cecum,                            identified by appendiceal orifice and ileocecal                            valve. The ileocecal valve, appendiceal orifice,                            and rectum were photographed. The quality of the                            bowel preparation was excellent. The colonoscopy                            was performed without difficulty. The patient                            tolerated the procedure well. Scope In: 9:05:00 AM Scope Out: 9:21:26 AM Scope Withdrawal Time: 0 hours 14 minutes 40 seconds  Total Procedure Duration: 0 hours 16 minutes 26 seconds  Findings:                 The perianal and digital rectal examinations were                            normal.                           Four sessile polyps were found in the sigmoid colon                            (2) and transverse colon (2). The polyps were 6 to                            7 mm in size. These polyps were removed with a cold  snare. Resection and retrieval were complete.                           Two sessile polyps were found in the sigmoid colon                            and transverse colon. The polyps were 4 mm in size.                            These polyps were removed with a cold biopsy                            forceps. Resection and retrieval were complete.                           The exam was otherwise without abnormality on                            direct and retroflexion views. Complications:            No immediate complications. Estimated blood loss:                            None. Estimated Blood Loss:     Estimated blood loss: none. Impression:               - Four 6 to 7 mm polyps in the sigmoid colon and in                            the transverse colon, removed with a cold snare.                            Resected and retrieved.                            - Two 4 mm polyps in the sigmoid colon and in the                            transverse colon, removed with a cold biopsy                            forceps. Resected and retrieved.                           - The examination was otherwise normal on direct                            and retroflexion views. Recommendation:           - Repeat colonoscopy in 3-5 years for                            surveillance/screening pending pathology review.                           -  Patient has a contact number available for                            emergencies. The signs and symptoms of potential                            delayed complications were discussed with the                            patient. Return to normal activities tomorrow.                            Written discharge instructions were provided to the                            patient.                           - Resume previous diet.                           - Continue present medications.                           - Await pathology results. Ladene Artist, MD 12/06/2017 9:25:15 AM This report has been signed electronically.

## 2017-12-06 NOTE — Progress Notes (Signed)
Called to room to assist during endoscopic procedure.  Patient ID and intended procedure confirmed with present staff. Received instructions for my participation in the procedure from the performing physician.  

## 2017-12-06 NOTE — Progress Notes (Signed)
Pt's states no medical or surgical changes since previsit or office visit. 

## 2017-12-06 NOTE — Progress Notes (Signed)
Report to PACU, RN, vss, BBS= Clear.  

## 2017-12-06 NOTE — Patient Instructions (Signed)
YOU HAD AN ENDOSCOPIC PROCEDURE TODAY AT [facility]:   Refer to the procedure report that was given to you for any specific questions about what was found during the examination.  If the procedure report does not answer your questions, please call your gastroenterologist to clarify.  If you requested that your care partner not be given the details of your procedure findings, then the procedure report has been included in a sealed envelope for you to review at your convenience later.  YOU SHOULD EXPECT: Some feelings of bloating in the abdomen. Passage of more gas than usual.  Walking can help get rid of the air that was put into your GI tract during the procedure and reduce the bloating. If you had a lower endoscopy (such as a colonoscopy or flexible sigmoidoscopy) you may notice spotting of blood in your stool or on the toilet paper. If you underwent a bowel prep for your procedure, you may not have a normal bowel movement for a few days.  Please Note:  You might notice some irritation and congestion in your nose or some drainage.  This is from the oxygen used during your procedure.  There is no need for concern and it should clear up in a day or so.  SYMPTOMS TO REPORT IMMEDIATELY:   Following lower endoscopy (colonoscopy or flexible sigmoidoscopy):  Excessive amounts of blood in the stool  Significant tenderness or worsening of abdominal pains  Swelling of the abdomen that is new, acute  Fever of 100F or higher   For urgent or emergent issues, a gastroenterologist can be reached at any hour by calling [phone].   DIET:  We do recommend a small meal at first, but then you may proceed to your regular diet.  Drink plenty of fluids but you should avoid alcoholic beverages for 24 hours.  Force fluids today.  ACTIVITY:  You should plan to take it easy for the rest of today and you should NOT DRIVE or use heavy machinery until tomorrow (because of the sedation medicines used  during the test).    FOLLOW UP: Our staff will call the number listed on your records the next business day following your procedure to check on you and address any questions or concerns that you may have regarding the information given to you following your procedure. If we do not reach you, we will leave a message.  However, if you are feeling well and you are not experiencing any problems, there is no need to return our call.  We will assume that you have returned to your regular daily activities without incident.  If any biopsies were taken you will be contacted by phone or by letter within the next 1-3 weeks.  Please call us at [phone] if you have not heard about the biopsies in 3 weeks.    SIGNATURES/CONFIDENTIALITY: You and/or your care partner have signed paperwork which will be entered into your electronic medical record.  These signatures attest to the fact that that the information above on your After Visit Summary has been reviewed and is understood.  Full responsibility of the confidentiality of this discharge information lies with you and/or your care-partner.  Read all handouts given to you by your recovery room nurse.

## 2017-12-09 ENCOUNTER — Telehealth: Payer: Self-pay

## 2017-12-09 NOTE — Telephone Encounter (Signed)
  Follow up Call-  Call back number 12/06/2017  Post procedure Call Back phone  # [phone]  Permission to leave phone message Yes  Some recent data might be hidden     Patient questions:  Do you have a fever, pain , or abdominal swelling? No. Pain Score  0 *  Have you tolerated food without any problems? Yes.    Have you been able to return to your normal activities? Yes.    Do you have any questions about your discharge instructions: Diet   No. Medications  No. Follow up visit  No.  Do you have questions or concerns about your Care? No.  Actions: * If pain score is 4 or above: No action needed, pain <4.

## 2017-12-14 ENCOUNTER — Encounter: Payer: Self-pay | Admitting: Gastroenterology

## 2018-01-01 IMAGING — MG 2D DIGITAL SCREENING BILATERAL MAMMOGRAM WITH CAD AND ADJUNCT TO
8 of 12 series · 8 of 28 positions shown · non-contrast
Comparison: Previous exam(s).

CLINICAL DATA: Screening.

EXAM:
2D DIGITAL SCREENING BILATERAL MAMMOGRAM WITH CAD AND ADJUNCT TOMO

[R CC synth-2D]
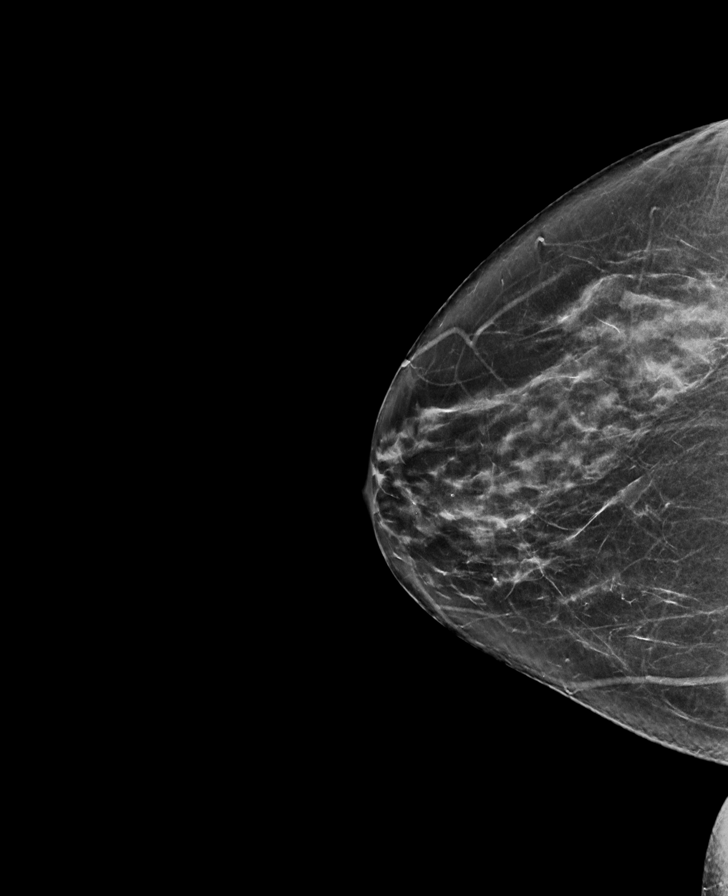

[R MLO]
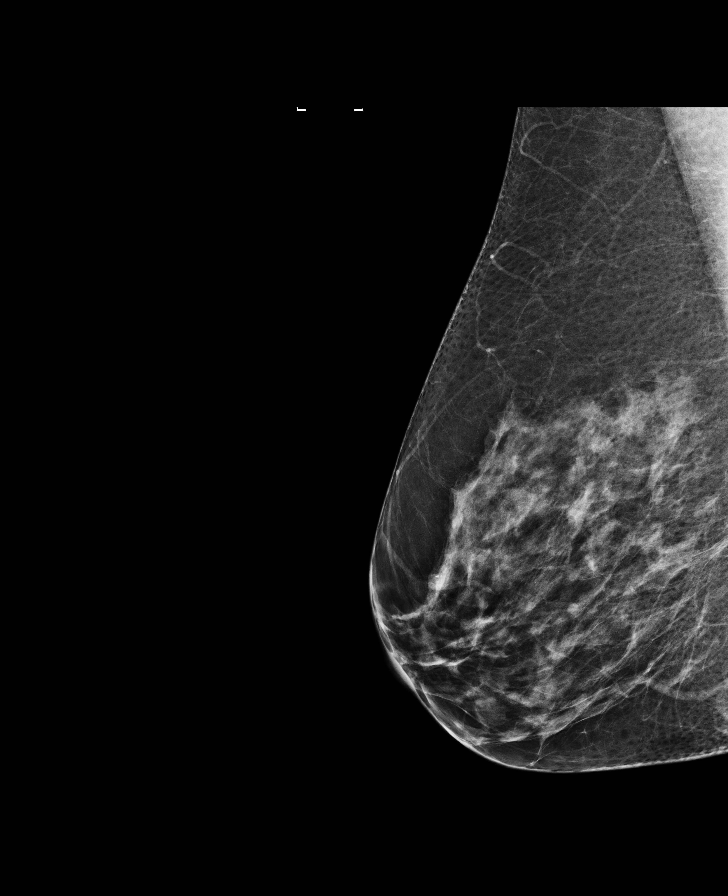

[L MLO]
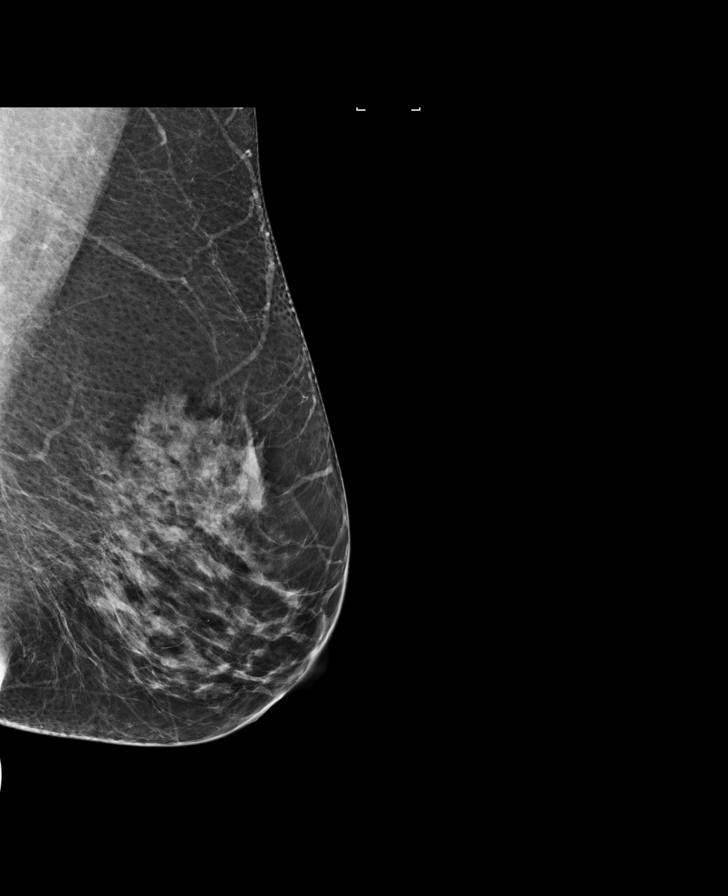

[L MLO synth-2D]
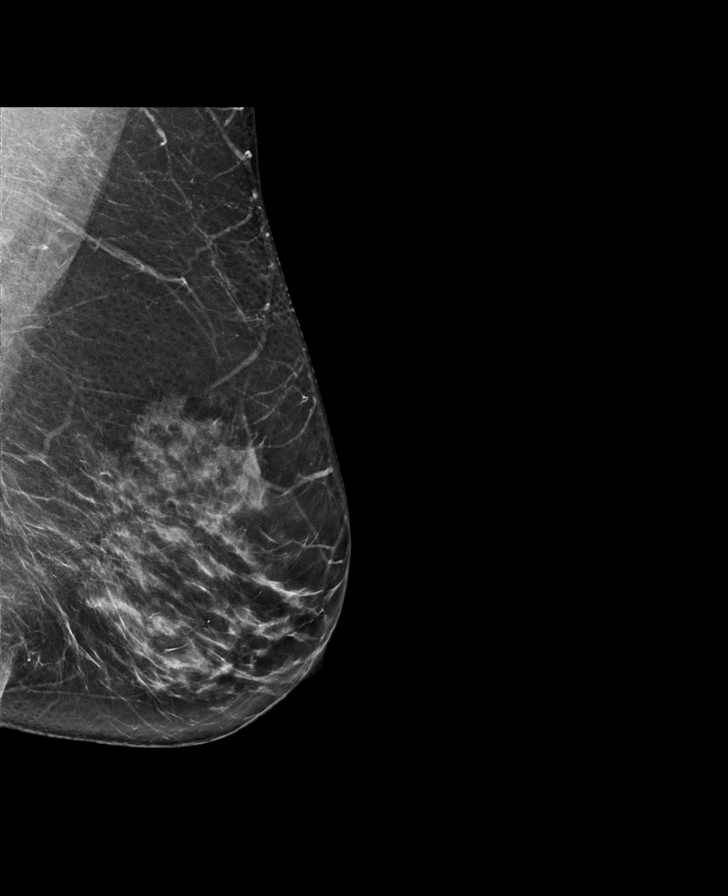

[L CC]
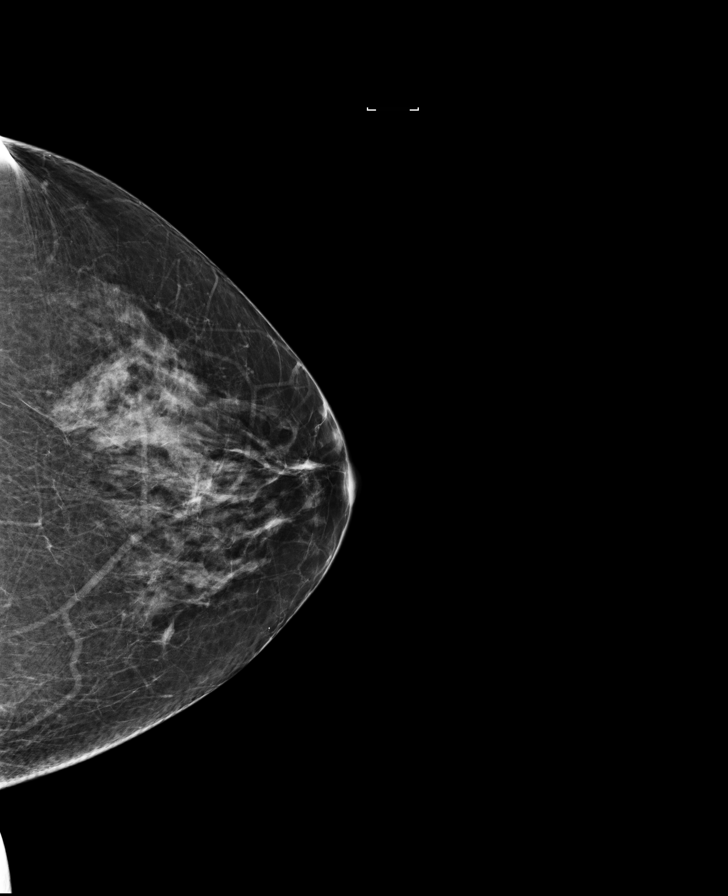

[R CC]
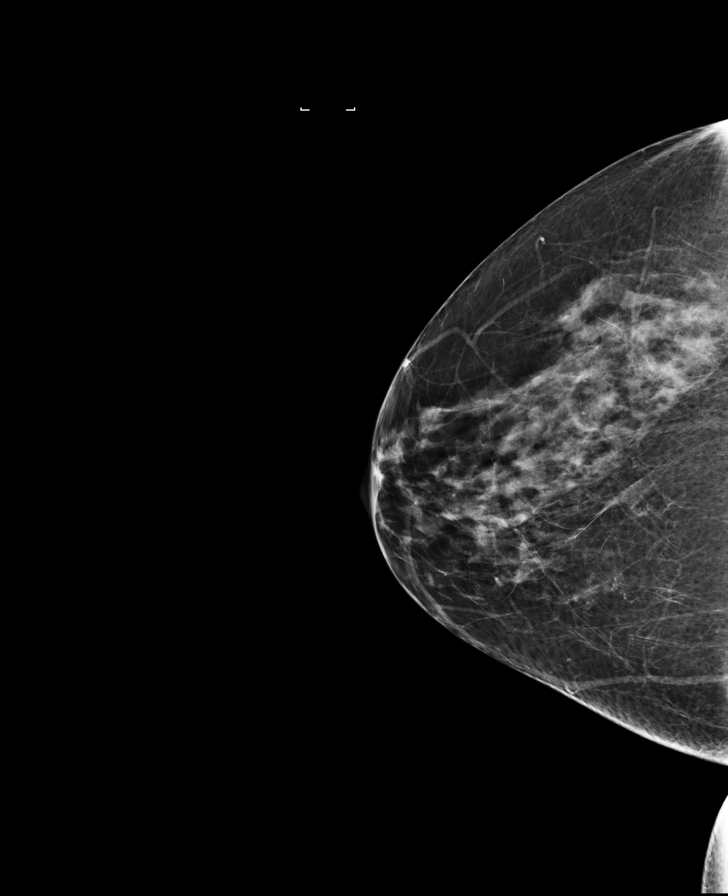

[R MLO synth-2D]
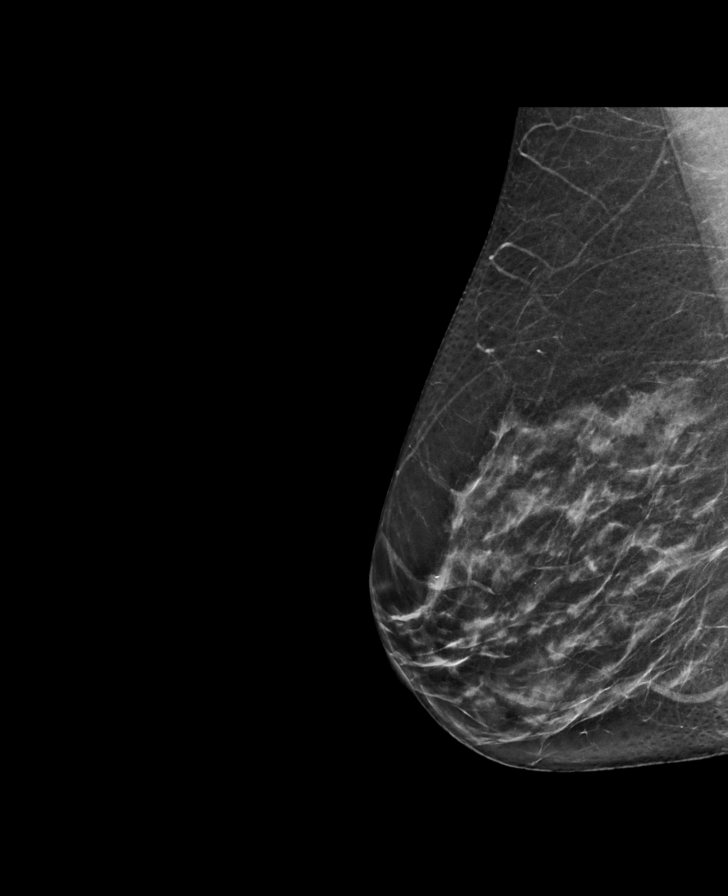

[L CC synth-2D]
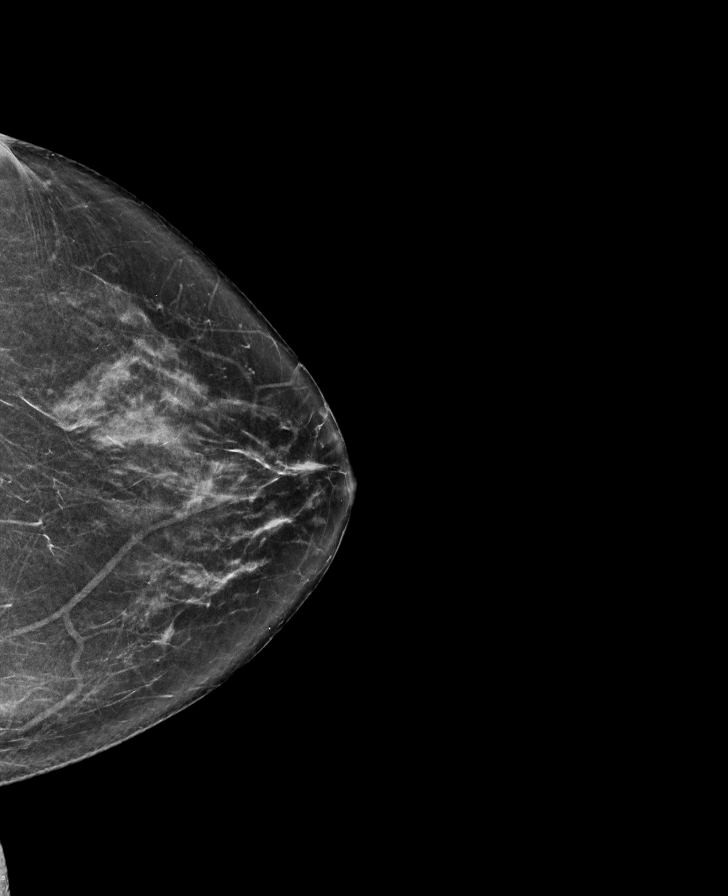

[8 of 28 positions shown; findings below may reference images not displayed]

ACR Breast Density Category c: The breast tissue is heterogeneously
dense, which may obscure small masses.
FINDINGS: There are no findings suspicious for malignancy. Images were
processed with CAD.
IMPRESSION: No mammographic evidence of malignancy. A result letter of this
screening mammogram will be mailed directly to the patient.

RECOMMENDATION:
Screening mammogram in one year. (Code:[id number])

BI-RADS CATEGORY  1: Negative.

## 2018-07-30 ENCOUNTER — Telehealth: Payer: Self-pay | Admitting: *Deleted

## 2018-07-30 ENCOUNTER — Encounter: Payer: Self-pay | Admitting: Gastroenterology

## 2018-07-30 NOTE — Telephone Encounter (Signed)
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  She denies this and would like for this diganosis to be removed from her chart.  I removed this per her request.

## 2018-09-04 DIAGNOSIS — H524 Presbyopia: Secondary | ICD-10-CM | POA: Diagnosis not present

## 2018-09-04 DIAGNOSIS — H40013 Open angle with borderline findings, low risk, bilateral: Secondary | ICD-10-CM | POA: Diagnosis not present

## 2018-09-04 DIAGNOSIS — H25013 Cortical age-related cataract, bilateral: Secondary | ICD-10-CM | POA: Diagnosis not present

## 2018-09-04 DIAGNOSIS — H2513 Age-related nuclear cataract, bilateral: Secondary | ICD-10-CM | POA: Diagnosis not present

## 2018-09-08 ENCOUNTER — Telehealth: Payer: Self-pay | Admitting: Family Medicine

## 2018-09-11 NOTE — Telephone Encounter (Signed)
closed

## 2018-09-19 ENCOUNTER — Encounter: Payer: 59 | Admitting: Family Medicine

## 2018-09-26 ENCOUNTER — Other Ambulatory Visit: Payer: Self-pay | Admitting: Family Medicine

## 2018-09-26 DIAGNOSIS — Z1231 Encounter for screening mammogram for malignant neoplasm of breast: Secondary | ICD-10-CM

## 2018-10-03 ENCOUNTER — Ambulatory Visit
Admission: RE | Admit: 2018-10-03 | Discharge: 2018-10-03 | Disposition: A | Discharge status: Home / Self Care | Payer: 59 | Source: Ambulatory Visit

## 2018-10-03 DIAGNOSIS — Z1231 Encounter for screening mammogram for malignant neoplasm of breast: Secondary | ICD-10-CM | POA: Diagnosis not present

## 2018-11-11 ENCOUNTER — Other Ambulatory Visit: Payer: Self-pay

## 2018-11-11 ENCOUNTER — Ambulatory Visit (INDEPENDENT_AMBULATORY_CARE_PROVIDER_SITE_OTHER): Payer: 59 | Admitting: Family Medicine

## 2018-11-11 ENCOUNTER — Encounter: Payer: Self-pay | Admitting: Family Medicine

## 2018-11-11 VITALS — BP 106/66 | HR 73 | Temp 98.6°F | Resp 16 | Ht 65.0 in | Wt 167.4 lb

## 2018-11-11 DIAGNOSIS — Z13220 Encounter for screening for lipoid disorders: Secondary | ICD-10-CM | POA: Diagnosis not present

## 2018-11-11 DIAGNOSIS — Z131 Encounter for screening for diabetes mellitus: Secondary | ICD-10-CM | POA: Diagnosis not present

## 2018-11-11 DIAGNOSIS — Z0000 Encounter for general adult medical examination without abnormal findings: Secondary | ICD-10-CM

## 2018-11-11 DIAGNOSIS — Z23 Encounter for immunization: Secondary | ICD-10-CM | POA: Diagnosis not present

## 2018-11-11 DIAGNOSIS — Z1159 Encounter for screening for other viral diseases: Secondary | ICD-10-CM | POA: Diagnosis not present

## 2018-11-11 DIAGNOSIS — Z833 Family history of diabetes mellitus: Secondary | ICD-10-CM | POA: Diagnosis not present

## 2018-11-11 DIAGNOSIS — Z136 Encounter for screening for cardiovascular disorders: Secondary | ICD-10-CM | POA: Diagnosis not present

## 2018-11-11 DIAGNOSIS — Z299 Encounter for prophylactic measures, unspecified: Secondary | ICD-10-CM | POA: Diagnosis not present

## 2018-11-11 MED ORDER — VALACYCLOVIR HCL 500 MG PO TABS: 500.0000 mg | ORAL_TABLET | Freq: Two times a day (BID) | ORAL | 1 refills | Status: DC

## 2018-11-11 MED FILL — VALACYCLOVIR HCL 500 MG TAB: 500 | 45 days supply | Qty: 90 | Fill #0

## 2018-11-11 NOTE — Patient Instructions (Addendum)
   From [email or web address] . Last Reviewed: May 24, 2016  . Last Edited: July 16, 2016   Protein Foods Foods high in protein such as fish, chicken, meats, soy products, and cheese, are all called "protein foods." You may also hear them referred to as 'meats or meat substitutes." The biggest difference among foods in this group is how much fat they contain, and for the vegetarian proteins, whether they have carbohydrate.  Protein Choices Plant-Based Proteins Plant-based protein foods provide quality protein, healthy fats, and fiber. They vary in how much fat and carbohydrate they contain, so make sure to read labels. . Beans such as black, kidney, and pinto  . Bean products like baked beans and refried beans  . Hummus and falafel  . Lentils such as brown, green, or yellow  . Peas such as black-eyed or split peas  . Edamame  . Soy nuts  . Nuts and spreads like almond butter, cashew butter, or peanut butter  . Tempeh, tofu  . Products like meatless "chicken" nuggets, "beef" crumbles, "burgers", "bacon", "sausage", and "hot dogs"  Fish and Seafood Try to include fish at least 2 times per week. . Fish high in omega-3 fatty acids like [place] tuna, herring, mackerel, rainbow trout, sardines, and salmon  . Other fish including catfish, cod, flounder, haddock, halibut, orange roughy, and tilapia  . Shellfish including clams, crab, imitation shellfish, lobster, scallops, shrimp, oysters. Agricultural consultant without the skin for less saturated fat and cholesterol. . Chicken, [place], cornish hen .  Cheese and Eggs . Reduced-fat cheese or regular cheese in small amounts  . Cottage cheese  . Whole eggs Game . Buffalo, ostrich, rabbit, venison  . Dove, duck, goose, or pheasant (no skin) Beef, Pork, Veal, Lamb It's best to limit your intake of red meat which is often higher in saturated fat and processed meats like ham, bacon and hot dogs which are often higher in saturated fat and  sodium. If you decide to have these, choose the leanest options, which are: . Select or Choice grades of beef trimmed of fat including: chuck, rib, rump roast, round, sirloin, cubed, flank, porterhouse, T-bone steak, tenderloin  . Lamb: chop, leg, or roast  . Veal: loin chop or roast  . Pork: Canadian bacon, center loin chop, ham, tenderloin      If you have lab work done today you will be contacted with your lab results within the next 2 weeks.  If you have not heard from [place] then please contact us. The fastest way to get your results is to register for My Chart.   IF you received an x-ray today, you will receive an invoice from [facility]. Please contact [facility] at [phone] with questions or concerns regarding your invoice.   IF you received labwork today, you will receive an invoice from [facility]. Please contact [facility] at [phone] with questions or concerns regarding your invoice.   Our billing staff will not be able to assist you with questions regarding bills from these companies.  You will be contacted with the lab results as soon as they are available. The fastest way to get your results is to activate your My Chart account. Instructions are located on the last page of this paperwork. If you have not heard from [place] regarding the results in 2 weeks, please contact this office.

## 2018-11-11 NOTE — Progress Notes (Signed)
Chief Complaint  Patient presents with  . Annual Exam    cpe without pap  . Medication Refill    valcyclovir    Subjective:  Abigail Foley is a 58 y.o. female here for a health maintenance visit.  Patient is established pt  Patient Active Problem List   Diagnosis Date Noted  . Glaucoma 01/06/2016  . Varicose veins of lower extremities with other complications 09/32/3557  . Pain in limb 08/06/2013  . UPPER RESPIRATORY INFECTION 08/31/2008  . ALLERGIC RHINITIS 08/31/2008  . 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 Allergy   . Anemia   . Anxiety   . Asthma   . Blood transfusion without reported diagnosis   . Hot flashes     Past Surgical History:  Procedure Laterality Date  . ABLATION    . CESAREAN SECTION    . DILATION AND CURETTAGE OF UTERUS    . ECTOPIC PREGNANCY SURGERY    . TUBAL LIGATION    . WISDOM TOOTH EXTRACTION       Outpatient Medications Prior to Visit  Medication Sig Dispense Refill  . Biotin 10000 MCG TABS Take by mouth.    . Cholecalciferol (VITAMIN D3) 2000 UNITS capsule Take 5,000 Units by mouth daily.     . cyanocobalamin 1000 MCG tablet Take 100 mcg by mouth daily. Pt taking 2500 mcg every day    . folic acid (FOLVITE) 322 MCG tablet Take 800 mcg by mouth daily.     Marland Kitchen loratadine (CLARITIN) 10 MG tablet Take 10 mg by mouth daily.    . Zinc 50 MG TABS Take by mouth daily.    . valACYclovir (VALTREX) 500 MG tablet Take 500 mg by mouth 2 (two) times daily.     Facility-Administered Medications Prior to Visit  Medication Dose Route Frequency Provider Last Rate Last Dose  . 0.9 %  sodium chloride infusion  500 mL Intravenous Once Ladene Artist, MD        Allergies  Allergen Reactions  . Cefaclor     REACTION: anaphalaxis  . Sulfa Antibiotics Hives     Family History  Problem Relation Age of Onset  . Hypertension Mother   . Diabetes Father   . Hyperlipidemia Father   .  Hyperlipidemia Sister   . Breast cancer Sister   . Cancer Brother   . Hyperlipidemia Brother   . Hypertension Brother   . Esophageal cancer Brother   . Colon cancer Maternal Aunt   . Liver cancer Maternal Uncle   . Pancreatic cancer Neg Hx   . Rectal cancer Neg Hx   . Stomach cancer Neg Hx      Health Habits: Dental Exam: up to date Eye Exam: up to date Exercise: 7 times/week on average Current exercise activities: walking Diet: balanced  Social History   Socioeconomic History  . Marital status: Married    Spouse name: Not on file  . Number of children: Not on file  . Years of education: Not on file  . Highest education level: Not on file  Occupational History  . Not on file  Social Needs  . Financial resource strain: Not on file  . Food insecurity:    Worry: Not on file    Inability: Not on file  . Transportation needs:    Medical: Not on file    Non-medical: Not on file  Tobacco Use  . Smoking status: Former Smoker  Types: Cigarettes    Last attempt to quit: 10/01/2005    Years since quitting: 13.1  . Smokeless tobacco: Never Used  Substance and Sexual Activity  . Alcohol use: Yes    Comment: occasional  . Drug use: No  . Sexual activity: Not on file  Lifestyle  . Physical activity:    Days per week: Not on file    Minutes per session: Not on file  . Stress: Not on file  Relationships  . Social connections:    Talks on phone: Not on file    Gets together: Not on file    Attends religious service: Not on file    Active member of club or organization: Not on file    Attends meetings of clubs or organizations: Not on file    Relationship status: Not on file  . Intimate partner violence:    Fear of current or ex partner: Not on file    Emotionally abused: Not on file    Physically abused: Not on file    Forced sexual activity: Not on file  Other Topics Concern  . Not on file  Social History Narrative  . Not on file   Social History   Substance  and Sexual Activity  Alcohol Use Yes   Comment: occasional   Social History   Tobacco Use  Smoking Status Former Smoker  . Types: Cigarettes  . Last attempt to quit: 10/01/2005  . Years since quitting: 13.1  Smokeless Tobacco Never Used   Social History   Substance and Sexual Activity  Drug Use No    GYN: Sexual Health Menstrual status: regular menses LMP: No LMP recorded. Patient is postmenopausal. Last pap smear: see HM section History of abnormal pap smears:  Sexually active: with female partner Current contraception: postmenopausal  Health Maintenance: See under health Maintenance activity for review of completion dates as well. Immunization History  Administered Date(s) Administered  . Influenza-Unspecified 07/01/2016, 06/21/2017  . Tdap 10/02/2007, 11/11/2018      Depression Screen-PHQ2/9 Depression screen [facility] 2/9 11/11/2018 09/18/2017 08/29/2016 09/14/2015 12/21/2013  Decreased Interest 0 0 0 0 0  Down, Depressed, Hopeless 0 0 0 0 0  PHQ - 2 Score 0 0 0 0 0       Depression Severity and Treatment Recommendations:  0-4= None  5-9= Mild / Treatment: Support, educate to call if worse; return in one month  10-14= Moderate / Treatment: Support, watchful waiting; Antidepressant or Psycotherapy  15-19= Moderately severe / Treatment: Antidepressant OR Psychotherapy  >= 20 = Major depression, severe / Antidepressant AND Psychotherapy    Review of Systems   ROS  See HPI for ROS as well.   Review of Systems  Constitutional: Negative for activity change, appetite change, chills and fever.  HENT: Negative for congestion, nosebleeds, trouble swallowing and voice change.   Respiratory: Negative for cough, shortness of breath and wheezing.   Gastrointestinal: Negative for diarrhea, nausea and vomiting.  Genitourinary: Negative for difficulty urinating, dysuria, flank pain and hematuria.  Musculoskeletal: Negative for back pain, joint swelling and neck pain.   Neurological: Negative for dizziness, speech difficulty, light-headedness and numbness.  See HPI. All other review of systems negative.    Objective:   Vitals:   11/11/18 0817  BP: 106/66  Pulse: 73  Resp: 16  Temp: 98.6 F (37 C)  TempSrc: Oral  SpO2: 99%  Weight: 167 lb 6.4 oz (75.9 kg)  Height: 5\' 5"  (1.651 m)   Wt Readings from Last  3 Encounters:  11/11/18 167 lb 6.4 oz (75.9 kg)  12/06/17 171 lb (77.6 kg)  11/28/17 171 lb 9.6 oz (77.8 kg)   Body mass index is 27.86 kg/m.  Physical Exam  BP 106/66 (BP Location: Right Arm, Patient Position: Sitting, Cuff Size: Large)   Pulse 73   Temp 98.6 F (37 C) (Oral)   Resp 16   Ht 5\' 5"  (1.651 m)   Wt 167 lb 6.4 oz (75.9 kg)   SpO2 99%   BMI 27.86 kg/m   General Appearance:    Alert, cooperative, no distress, appears stated age  Head:    Normocephalic, without obvious abnormality, atraumatic  Eyes:    PERRL, conjunctiva/corneas clear, EOM's intact  Ears:    Normal TM's and external ear canals, both ears  Nose:   Nares normal, septum midline, mucosa normal, no drainage    or sinus tenderness  Throat:   Lips, mucosa, and tongue normal; teeth and gums normal  Neck:   Supple, symmetrical, trachea midline, no adenopathy;    thyroid:  no enlargement/tenderness/nodules; no carotid   bruit or JVD  Back:     Symmetric, no curvature, ROM normal, no CVA tenderness  Lungs:     Clear to auscultation bilaterally, respirations unlabored  Chest Wall:    No tenderness or deformity   Heart:    Regular rate and rhythm, S1 and S2 normal, no murmur, rub   or gallop  Breast Exam:    No tenderness, masses, or nipple abnormality  Abdomen:     Soft, non-tender, bowel sounds active all four quadrants,    no masses, no organomegaly  Extremities:   Extremities normal, atraumatic, no cyanosis or edema  Pulses:   2+ and symmetric all extremities  Skin:   Skin color, texture, turgor normal, no rashes or lesions  Lymph nodes:   Cervical,  supraclavicular, and axillary nodes normal  Neurologic:   CNII-XII intact, normal strength, sensation and reflexes    throughout       Assessment/Plan:   Patient was seen for a health maintenance exam.  Counseled the patient on health maintenance issues. Reviewed her health mainteance schedule and ordered appropriate tests (see orders.) Counseled on regular exercise and weight management. Recommend regular eye exams and dental cleaning.   The following issues were addressed today for health maintenance:   Serenah was seen today for annual exam and medication refill.  Diagnoses and all orders for this visit:  Annual physical exam  -  Women's Health Maintenance Plan Advised monthly breast exam and annual mammogram Advised dental exam every six months Discussed stress management Discussed pap smear screening guidelines  Family history of diabetes mellitus in father -     Comprehensive metabolic panel  Encounter for lipid screening for cardiovascular disease -     Lipid panel -     Comprehensive metabolic panel  Need for vaccination -     Tdap vaccine greater than or equal to 7yo IM  Encounter for hepatitis C screening test for low risk patient -     HCV Ab w Reflex to Quant PCR  Screening for diabetes mellitus -     Hemoglobin A1c    Return in about 1 year (around 11/12/2019) for annual exam .    Body mass index is 27.86 kg/m.:  Discussed the patient's BMI with patient. The BMI body mass index is 27.86 kg/m.     No future appointments.  Patient Instructions     From [email or web address] .  Last Reviewed: May 24, 2016  . Last Edited: July 16, 2016   Protein Foods Foods high in protein such as fish, chicken, meats, soy products, and cheese, are all called "protein foods." You may also hear them referred to as 'meats or meat substitutes." The biggest difference among foods in this group is how much fat they contain, and for the vegetarian proteins, whether they  have carbohydrate.  Protein Choices Plant-Based Proteins Plant-based protein foods provide quality protein, healthy fats, and fiber. They vary in how much fat and carbohydrate they contain, so make sure to read labels. . Beans such as black, kidney, and pinto  . Bean products like baked beans and refried beans  . Hummus and falafel  . Lentils such as brown, green, or yellow  . Peas such as black-eyed or split peas  . Edamame  . Soy nuts  . Nuts and spreads like almond butter, cashew butter, or peanut butter  . Tempeh, tofu  . Products like meatless "chicken" nuggets, "beef" crumbles, "burgers", "bacon", "sausage", and "hot dogs"  Fish and Seafood Try to include fish at least 2 times per week. . Fish high in omega-3 fatty acids like [place] tuna, herring, mackerel, rainbow trout, sardines, and salmon  . Other fish including catfish, cod, flounder, haddock, halibut, orange roughy, and tilapia  . Shellfish including clams, crab, imitation shellfish, lobster, scallops, shrimp, oysters. Agricultural consultant without the skin for less saturated fat and cholesterol. . Chicken, [place], cornish hen .  Cheese and Eggs . Reduced-fat cheese or regular cheese in small amounts  . Cottage cheese  . Whole eggs Game . Buffalo, ostrich, rabbit, venison  . Dove, duck, goose, or pheasant (no skin) Beef, Pork, Veal, Lamb It's best to limit your intake of red meat which is often higher in saturated fat and processed meats like ham, bacon and hot dogs which are often higher in saturated fat and sodium. If you decide to have these, choose the leanest options, which are: . Select or Choice grades of beef trimmed of fat including: chuck, rib, rump roast, round, sirloin, cubed, flank, porterhouse, T-bone steak, tenderloin  . Lamb: chop, leg, or roast  . Veal: loin chop or roast  . Pork: Canadian bacon, center loin chop, ham, tenderloin      If you have lab work done today you will be contacted with  your lab results within the next 2 weeks.  If you have not heard from [place] then please contact us. The fastest way to get your results is to register for My Chart.   IF you received an x-ray today, you will receive an invoice from [facility]. Please contact [facility] at [phone] with questions or concerns regarding your invoice.   IF you received labwork today, you will receive an invoice from [facility]. Please contact [facility] at [phone] with questions or concerns regarding your invoice.   Our billing staff will not be able to assist you with questions regarding bills from these companies.  You will be contacted with the lab results as soon as they are available. The fastest way to get your results is to activate your My Chart account. Instructions are located on the last page of this paperwork. If you have not heard from [place] regarding the results in 2 weeks, please contact this office.

## 2018-11-12 LAB — COMPREHENSIVE METABOLIC PANEL
A/G RATIO: 2 (ref 1.2–2.2)
ALK PHOS: 83 IU/L (ref 39–117)
ALT: 15 IU/L (ref 0–32)
AST: 17 IU/L (ref 0–40)
Albumin: 4.7 g/dL (ref 3.8–4.9)
BUN/Creatinine Ratio: 14 (ref 9–23)
BUN: 10 mg/dL (ref 6–24)
Bilirubin Total: 0.2 mg/dL (ref 0.0–1.2)
CHLORIDE: 106 mmol/L (ref 96–106)
CO2: 20 mmol/L (ref 20–29)
Calcium: 9.5 mg/dL (ref 8.7–10.2)
Creatinine, Ser: 0.73 mg/dL (ref 0.57–1.00)
GFR calc Af Amer: 105 mL/min/{1.73_m2} (ref >59)
GFR calc non Af Amer: 91 mL/min/{1.73_m2} (ref >59)
GLOBULIN, TOTAL: 2.3 g/dL (ref 1.5–4.5)
Glucose: 94 mg/dL (ref 65–99)
POTASSIUM: 4.5 mmol/L (ref 3.5–5.2)
SODIUM: 142 mmol/L (ref 134–144)
Total Protein: 7 g/dL (ref 6.0–8.5)

## 2018-11-12 LAB — HEMOGLOBIN A1C
Est. average glucose Bld gHb Est-mCnc: 105 mg/dL
Hgb A1c MFr Bld: 5.3 % (ref 4.8–5.6)

## 2018-11-12 LAB — LIPID PANEL
CHOL/HDL RATIO: 4.3 ratio (ref 0.0–4.4)
Cholesterol, Total: 215 mg/dL — ABNORMAL HIGH (ref 100–199)
HDL: 50 mg/dL (ref >39)
LDL CALC: 133 mg/dL — AB (ref 0–99)
TRIGLYCERIDES: 161 mg/dL — AB (ref 0–149)
VLDL Cholesterol Cal: 32 mg/dL (ref 5–40)

## 2018-11-12 LAB — HCV INTERPRETATION

## 2018-11-12 LAB — HCV AB W REFLEX TO QUANT PCR: HCV Ab: <0.1 s/co ratio (ref 0.0–0.9)

## 2019-01-27 ENCOUNTER — Encounter: Payer: Self-pay | Admitting: Family Medicine

## 2019-01-27 NOTE — Telephone Encounter (Signed)
Good afternoon,   Would you please reach out to Ms. Schweizer to schedule a virtual visit for a UTI?  She will also need to come into the clinic prior to her visit and provide a urine sample.    Thank you,  Wilfred Curtis

## 2019-11-20 ENCOUNTER — Other Ambulatory Visit: Payer: Self-pay | Admitting: Family Medicine

## 2019-11-20 DIAGNOSIS — Z1231 Encounter for screening mammogram for malignant neoplasm of breast: Secondary | ICD-10-CM

## 2019-11-25 ENCOUNTER — Ambulatory Visit
Admission: RE | Admit: 2019-11-25 | Discharge: 2019-11-25 | Disposition: A | Discharge status: Home / Self Care | Payer: 59 | Source: Ambulatory Visit

## 2019-11-25 ENCOUNTER — Other Ambulatory Visit: Payer: Self-pay

## 2019-11-25 DIAGNOSIS — Z1231 Encounter for screening mammogram for malignant neoplasm of breast: Secondary | ICD-10-CM

## 2019-12-23 ENCOUNTER — Encounter: Payer: Self-pay | Admitting: Family Medicine

## 2020-02-09 ENCOUNTER — Encounter: Payer: 59 | Admitting: Family Medicine

## 2020-02-11 ENCOUNTER — Encounter: Payer: Self-pay | Admitting: Family Medicine

## 2020-02-11 ENCOUNTER — Other Ambulatory Visit: Payer: Self-pay

## 2020-02-11 ENCOUNTER — Ambulatory Visit (INDEPENDENT_AMBULATORY_CARE_PROVIDER_SITE_OTHER): Payer: 59 | Admitting: Family Medicine

## 2020-02-11 VITALS — BP 98/65 | HR 72 | Temp 98.0°F | Resp 17 | Ht 65.0 in | Wt 171.0 lb

## 2020-02-11 DIAGNOSIS — Z0000 Encounter for general adult medical examination without abnormal findings: Secondary | ICD-10-CM

## 2020-02-11 DIAGNOSIS — Z114 Encounter for screening for human immunodeficiency virus [HIV]: Secondary | ICD-10-CM | POA: Diagnosis not present

## 2020-02-11 DIAGNOSIS — E7800 Pure hypercholesterolemia, unspecified: Secondary | ICD-10-CM

## 2020-02-11 DIAGNOSIS — M2569 Stiffness of other specified joint, not elsewhere classified: Secondary | ICD-10-CM | POA: Diagnosis not present

## 2020-02-11 DIAGNOSIS — Z0001 Encounter for general adult medical examination with abnormal findings: Secondary | ICD-10-CM | POA: Diagnosis not present

## 2020-02-11 NOTE — Patient Instructions (Addendum)
If you have lab work done today you will be contacted with your lab results within the next 2 weeks.  If you have not heard from [place] then please contact us. The fastest way to get your results is to register for My Chart.   IF you received an x-ray today, you will receive an invoice from [facility]. Please contact [facility] at [phone] with questions or concerns regarding your invoice.   IF you received labwork today, you will receive an invoice from [facility]. Please contact [facility] at [phone] with questions or concerns regarding your invoice.   Our billing staff will not be able to assist you with questions regarding bills from these companies.  You will be contacted with the lab results as soon as they are available. The fastest way to get your results is to activate your My Chart account. Instructions are located on the last page of this paperwork. If you have not heard from [place] regarding the results in 2 weeks, please contact this office.     Back Exercises The following exercises strengthen the muscles that help to support the trunk and back. They also help to keep the lower back flexible. Doing these exercises can help to prevent back pain or lessen existing pain.  If you have back pain or discomfort, try doing these exercises 2-3 times each day or as told by your health care provider.  As your pain improves, do them once each day, but increase the number of times that you repeat the steps for each exercise (do more repetitions).  To prevent the recurrence of back pain, continue to do these exercises once each day or as told by your health care provider. Do exercises exactly as told by your health care provider and adjust them as directed. It is normal to feel mild stretching, pulling, tightness, or discomfort as you do these exercises, but you should stop right away if you feel sudden pain or your pain gets worse. Exercises Single knee to  chest Repeat these steps 3-5 times for each leg: 1. Lie on your back on a firm bed or the floor with your legs extended. 2. Bring one knee to your chest. Your other leg should stay extended and in contact with the floor. 3. Hold your knee in place by grabbing your knee or thigh with both hands and hold. 4. Pull on your knee until you feel a gentle stretch in your lower back or buttocks. 5. Hold the stretch for 10-30 seconds. 6. Slowly release and straighten your leg. Pelvic tilt Repeat these steps 5-10 times: 1. Lie on your back on a firm bed or the floor with your legs extended. 2. Bend your knees so they are pointing toward the ceiling and your feet are flat on the floor. 3. Tighten your lower abdominal muscles to press your lower back against the floor. This motion will tilt your pelvis so your tailbone points up toward the ceiling instead of pointing to your feet or the floor. 4. With gentle tension and even breathing, hold this position for 5-10 seconds. Cat-cow Repeat these steps until your lower back becomes more flexible: 1. Get into a hands-and-knees position on a firm surface. Keep your hands under your shoulders, and keep your knees under your hips. You may place padding under your knees for comfort. 2. Let your head hang down toward your chest. Contract your abdominal muscles and point your tailbone toward the floor so your lower back becomes rounded  like the back of a cat. 3. Hold this position for 5 seconds. 4. Slowly lift your head, let your abdominal muscles relax and point your tailbone up toward the ceiling so your back forms a sagging arch like the back of a cow. 5. Hold this position for 5 seconds.  Press-ups Repeat these steps 5-10 times: 1. Lie on your abdomen (face-down) on the floor. 2. Place your palms near your head, about shoulder-width apart. 3. Keeping your back as relaxed as possible and keeping your hips on the floor, slowly straighten your arms to raise the  top half of your body and lift your shoulders. Do not use your back muscles to raise your upper torso. You may adjust the placement of your hands to make yourself more comfortable. 4. Hold this position for 5 seconds while you keep your back relaxed. 5. Slowly return to lying flat on the floor.  Bridges Repeat these steps 10 times: 1. Lie on your back on a firm surface. 2. Bend your knees so they are pointing toward the ceiling and your feet are flat on the floor. Your arms should be flat at your sides, next to your body. 3. Tighten your buttocks muscles and lift your buttocks off the floor until your waist is at almost the same height as your knees. You should feel the muscles working in your buttocks and the back of your thighs. If you do not feel these muscles, slide your feet 1-2 inches farther away from your buttocks. 4. Hold this position for 3-5 seconds. 5. Slowly lower your hips to the starting position, and allow your buttocks muscles to relax completely. If this exercise is too easy, try doing it with your arms crossed over your chest. Abdominal crunches Repeat these steps 5-10 times: 1. Lie on your back on a firm bed or the floor with your legs extended. 2. Bend your knees so they are pointing toward the ceiling and your feet are flat on the floor. 3. Cross your arms over your chest. 4. Tip your chin slightly toward your chest without bending your neck. 5. Tighten your abdominal muscles and slowly raise your trunk (torso) high enough to lift your shoulder blades a tiny bit off the floor. Avoid raising your torso higher than that because it can put too much stress on your low back and does not help to strengthen your abdominal muscles. 6. Slowly return to your starting position. Back lifts Repeat these steps 5-10 times: 1. Lie on your abdomen (face-down) with your arms at your sides, and rest your forehead on the floor. 2. Tighten the muscles in your legs and your  buttocks. 3. Slowly lift your chest off the floor while you keep your hips pressed to the floor. Keep the back of your head in line with the curve in your back. Your eyes should be looking at the floor. 4. Hold this position for 3-5 seconds. 5. Slowly return to your starting position. Contact a health care provider if:  Your back pain or discomfort gets much worse when you do an exercise.  Your worsening back pain or discomfort does not lessen within 2 hours after you exercise. If you have any of these problems, stop doing these exercises right away. Do not do them again unless your health care provider says that you can. Get help right away if:  You develop sudden, severe back pain. If this happens, stop doing the exercises right away. Do not do them again unless your health care  provider says that you can. This information is not intended to replace advice given to you by your health care provider. Make sure you discuss any questions you have with your health care provider. Document Revised: 01/22/2019 Document Reviewed: 06/19/2018 Elsevier Patient Education  [place].

## 2020-02-11 NOTE — Progress Notes (Signed)
Chief Complaint  Patient presents with  . Annual Exam    no pap    Subjective:  Abigail Foley is a 59 y.o. female here for a health maintenance visit.  Patient is established pt    Patient Active Problem List   Diagnosis Date Noted  . Glaucoma 01/06/2016  . Varicose veins of lower extremities with other complications 81/15/7262  . Pain in limb 08/06/2013  . UPPER RESPIRATORY INFECTION 08/31/2008  . ALLERGIC RHINITIS 08/31/2008  . 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 Allergy   . Anemia   . Anxiety   . Asthma   . Blood transfusion without reported diagnosis   . Hot flashes     Past Surgical History:  Procedure Laterality Date  . ABLATION    . CESAREAN SECTION    . DILATION AND CURETTAGE OF UTERUS    . ECTOPIC PREGNANCY SURGERY    . TUBAL LIGATION    . WISDOM TOOTH EXTRACTION       Outpatient Medications Prior to Visit  Medication Sig Dispense Refill  . Cholecalciferol (VITAMIN D3) 2000 UNITS capsule Take 5,000 Units by mouth daily.     . cyanocobalamin 1000 MCG tablet Take 100 mcg by mouth daily. Pt taking 2500 mcg every day    . folic acid (FOLVITE) 035 MCG tablet Take 800 mcg by mouth daily.     . valACYclovir (VALTREX) 500 MG tablet Take 1 tablet (500 mg total) by mouth 2 (two) times daily. 90 tablet 1  . Zinc 50 MG TABS Take by mouth daily.    . Biotin 10000 MCG TABS Take by mouth.    . loratadine (CLARITIN) 10 MG tablet Take 10 mg by mouth daily.     Facility-Administered Medications Prior to Visit  Medication Dose Route Frequency Provider Last Rate Last Admin  . 0.9 %  sodium chloride infusion  500 mL Intravenous Once Ladene Artist, MD        Allergies  Allergen Reactions  . Cefaclor     REACTION: anaphalaxis  . Sulfa Antibiotics Hives     Family History  Problem Relation Age of Onset  . Hypertension Mother   . Diabetes Father   . Hyperlipidemia Father   . Hyperlipidemia Sister    . Breast cancer Sister   . Cancer Brother   . Hyperlipidemia Brother   . Hypertension Brother   . Esophageal cancer Brother   . Colon cancer Maternal Aunt   . Liver cancer Maternal Uncle   . Pancreatic cancer Neg Hx   . Rectal cancer Neg Hx   . Stomach cancer Neg Hx      Health Habits: Dental Exam: up to date Eye Exam: up to date Exercise:  times/week on average Current exercise activities: walking/running Diet: balanced  Social History   Socioeconomic History  . Marital status: Married    Spouse name: Not on file  . Number of children: Not on file  . Years of education: Not on file  . Highest education level: Not on file  Occupational History  . Not on file  Tobacco Use  . Smoking status: Former Smoker    Types: Cigarettes    Quit date: 10/01/2005    Years since quitting: 14.3  . Smokeless tobacco: Never Used  Substance and Sexual Activity  . Alcohol use: Yes    Comment: occasional  . Drug use: No  . Sexual activity: Not on  file  Other Topics Concern  . Not on file  Social History Narrative  . Not on file   Social Determinants of Health   Financial Resource Strain:   . Difficulty of Paying Living Expenses:   Food Insecurity:   . Worried About Charity fundraiser in the Last Year:   . Arboriculturist in the Last Year:   Transportation Needs:   . Film/video editor (Medical):   Marland Kitchen Lack of Transportation (Non-Medical):   Physical Activity:   . Days of Exercise per Week:   . Minutes of Exercise per Session:   Stress:   . Feeling of Stress :   Social Connections:   . Frequency of Communication with Friends and Family:   . Frequency of Social Gatherings with Friends and Family:   . Attends Religious Services:   . Active Member of Clubs or Organizations:   . Attends Archivist Meetings:   Marland Kitchen Marital Status:   Intimate Partner Violence:   . Fear of Current or Ex-Partner:   . Emotionally Abused:   Marland Kitchen Physically Abused:   . Sexually Abused:     Social History   Substance and Sexual Activity  Alcohol Use Yes   Comment: occasional   Social History   Tobacco Use  Smoking Status Former Smoker  . Types: Cigarettes  . Quit date: 10/01/2005  . Years since quitting: 14.3  Smokeless Tobacco Never Used   Social History   Substance and Sexual Activity  Drug Use No    GYN: Sexual Health Menstrual status: regular menses LMP: No LMP recorded. Patient is postmenopausal. Last pap smear: see HM section History of abnormal pap smears:  Sexually active: with female partner Current contraception:   Health Maintenance: See under health Maintenance activity for review of completion dates as well. Immunization History  Administered Date(s) Administered  . Influenza-Unspecified 07/01/2016, 06/21/2017, 06/18/2018  . PFIZER SARS-COV-2 Vaccination 10/08/2019, 10/28/2019  . Tdap 10/02/2007, 11/11/2018      Depression Screen-PHQ2/9 Depression screen [facility] 2/9 02/11/2020 11/11/2018 09/18/2017 08/29/2016 09/14/2015  Decreased Interest 0 0 0 0 0  Down, Depressed, Hopeless 0 0 0 0 0  PHQ - 2 Score 0 0 0 0 0       Depression Severity and Treatment Recommendations:  0-4= None  5-9= Mild / Treatment: Support, educate to call if worse; return in one month  10-14= Moderate / Treatment: Support, watchful waiting; Antidepressant or Psycotherapy  15-19= Moderately severe / Treatment: Antidepressant OR Psychotherapy  >= 20 = Major depression, severe / Antidepressant AND Psychotherapy    Review of Systems   ROS  See HPI for ROS as well.   Review of Systems  Constitutional: Negative for activity change, appetite change, chills and fever.  HENT: Negative for congestion, nosebleeds, trouble swallowing and voice change.   Respiratory: Negative for cough, shortness of breath and wheezing.   Gastrointestinal: Negative for diarrhea, nausea and vomiting.  Genitourinary: Negative for difficulty urinating, dysuria, flank pain and hematuria.   Musculoskeletal: Negative for back pain, joint swelling and neck pain.  Neurological: Negative for dizziness, speech difficulty, light-headedness and numbness.  See HPI. All other review of systems negative.    Objective:   Vitals:   02/11/20 0817  BP: 98/65  Pulse: 72  Resp: 17  Temp: 98 F (36.7 C)  TempSrc: Temporal  SpO2: 100%  Weight: 171 lb (77.6 kg)  Height: 5' 5"  (1.651 m)    Body mass index is 28.46 kg/m.  Physical Exam  Physical Exam  Constitutional: Oriented to person, place, and time. Appears well-developed and well-nourished.  HENT:  Head: Normocephalic and atraumatic.  Eyes: Conjunctivae and EOM are normal.  Ears: TM clear, normal external canal Neck: supple, no thyromegaly Cardiovascular: Normal rate, regular rhythm, normal heart sounds and intact distal pulses.  No murmur heard. Pulmonary/Chest: Effort normal and breath sounds normal. No stridor. No respiratory distress. Has no wheezes.  Neurological: Is alert and oriented to person, place, and time.  Skin: Skin is warm. Capillary refill takes less than 2 seconds.  Psychiatric: Has a normal mood and affect. Behavior is normal. Judgment and thought content normal.   Lumbar Radiculopathy Exam Back exam: full range of motion, no tenderness, palpable spasm or pain on motion. Straight-leg raise:  Negative bilaterally Reflexes:       Right leg: 2+ at knees bilaterally      Left leg: 2+ at knees bilaterally Strength: normal and equal bilaterally  Sensory exam: normal in both lower extremities.  Able to toe walk, heel walk without difficulty or obvious weakness. No obvious pain with hip motion or log rolling of leg.    Assessment/Plan:   Patient was seen for a health maintenance exam.  Counseled the patient on health maintenance issues. Reviewed her health mainteance schedule and ordered appropriate tests (see orders.) Counseled on regular exercise and weight management. Recommend regular eye exams and  dental cleaning.   The following issues were addressed today for health maintenance:   Loa was seen today for annual exam.  Diagnoses and all orders for this visit:  Annual physical exam- Women's Health Maintenance Plan Advised monthly breast exam and annual mammogram Advised dental exam every six months Discussed stress management Discussed pap smear screening guidelines    Screening for HIV (human immunodeficiency virus) -     HIV antibody (with reflex)  Elevated cholesterol -     Lipid panel -     CMP14+EGFR  Back stiffness- advised pt to go to PT -     Ambulatory referral to Physical Therapy    No follow-ups on file.    Body mass index is 28.46 kg/m.:  Discussed the patient's BMI with patient. The BMI body mass index is 28.46 kg/m.     No future appointments.  Patient Instructions       If you have lab work done today you will be contacted with your lab results within the next 2 weeks.  If you have not heard from [place] then please contact us. The fastest way to get your results is to register for My Chart.   IF you received an x-ray today, you will receive an invoice from [facility]. Please contact [facility] at [phone] with questions or concerns regarding your invoice.   IF you received labwork today, you will receive an invoice from [place]. Please contact [facility] at [phone] with questions or concerns regarding your invoice.   Our billing staff will not be able to assist you with questions regarding bills from these companies.  You will be contacted with the lab results as soon as they are available. The fastest way to get your results is to activate your My Chart account. Instructions are located on the last page of this paperwork. If you have not heard from [place] regarding the results in 2 weeks, please contact this office.

## 2020-02-12 LAB — CMP14+EGFR
ALT: 22 IU/L (ref 0–32)
AST: 20 IU/L (ref 0–40)
Albumin/Globulin Ratio: 2.1 (ref 1.2–2.2)
Albumin: 4.7 g/dL (ref 3.8–4.9)
Alkaline Phosphatase: 104 IU/L (ref 39–117)
BUN/Creatinine Ratio: 14 (ref 9–23)
BUN: 11 mg/dL (ref 6–24)
Bilirubin Total: 0.5 mg/dL (ref 0.0–1.2)
CO2: 22 mmol/L (ref 20–29)
Calcium: 9.7 mg/dL (ref 8.7–10.2)
Chloride: 108 mmol/L — ABNORMAL HIGH (ref 96–106)
Creatinine, Ser: 0.81 mg/dL (ref 0.57–1.00)
GFR calc Af Amer: 92 mL/min/{1.73_m2} (ref >59)
GFR calc non Af Amer: 80 mL/min/{1.73_m2} (ref >59)
Globulin, Total: 2.2 g/dL (ref 1.5–4.5)
Glucose: 99 mg/dL (ref 65–99)
Potassium: 4.6 mmol/L (ref 3.5–5.2)
Sodium: 143 mmol/L (ref 134–144)
Total Protein: 6.9 g/dL (ref 6.0–8.5)

## 2020-02-12 LAB — LIPID PANEL
Chol/HDL Ratio: 4.3 ratio (ref 0.0–4.4)
Cholesterol, Total: 221 mg/dL — ABNORMAL HIGH (ref 100–199)
HDL: 52 mg/dL (ref >39)
LDL Chol Calc (NIH): 142 mg/dL — ABNORMAL HIGH (ref 0–99)
Triglycerides: 153 mg/dL — ABNORMAL HIGH (ref 0–149)
VLDL Cholesterol Cal: 27 mg/dL (ref 5–40)

## 2020-02-12 LAB — HIV ANTIBODY (ROUTINE TESTING W REFLEX): HIV Screen 4th Generation wRfx: NONREACTIVE

## 2020-02-15 ENCOUNTER — Other Ambulatory Visit (HOSPITAL_COMMUNITY): Payer: Self-pay | Admitting: Family Medicine

## 2020-02-15 ENCOUNTER — Other Ambulatory Visit: Payer: Self-pay | Admitting: Family Medicine

## 2020-02-15 MED ORDER — ATORVASTATIN CALCIUM 10 MG PO TABS: 10.0000 mg | ORAL_TABLET | Freq: Every day | ORAL | 3 refills | Status: DC

## 2020-02-15 MED FILL — ATORVASTATIN 10 MG TABLET: 10 | 90 days supply | Qty: 90 | Fill #0

## 2020-03-29 ENCOUNTER — Telehealth: Payer: Self-pay | Admitting: Family Medicine

## 2020-03-29 NOTE — Telephone Encounter (Signed)
Spoke to patient which stated she see where these eruption of the skin is a side effect from the Lipitor.Patient stated she has been taking hot epison salt baths but do not know what else she need to do. Please advise

## 2020-03-29 NOTE — Telephone Encounter (Signed)
Please schedule patient in office visit with first available provider as these are not common side effects of lipitor. thanks

## 2020-03-29 NOTE — Telephone Encounter (Signed)
Copied from [place] [id number]. Topic: General - Call Back - No Documentation >> Mar 29, 2020  2:56 PM Erick Blinks wrote: Best contact: [phone] Pt called to report that she has been experiencing adverse side effects from her Lipitor. States that shortly after starting the Rx in May, she has been suffering from acne in her face and cysts in her groin area. Wants a call back from Clinic to discuss solutions. Please advise

## 2020-03-31 NOTE — Telephone Encounter (Signed)
Spoke with pt and scheduled appt with Dr. Linna Darner

## 2020-04-01 ENCOUNTER — Ambulatory Visit: Payer: 59 | Admitting: Family Medicine

## 2020-04-01 ENCOUNTER — Other Ambulatory Visit: Payer: Self-pay

## 2020-04-01 ENCOUNTER — Encounter: Payer: Self-pay | Admitting: Family Medicine

## 2020-04-01 VITALS — BP 120/76 | HR 93 | Temp 97.7°F | Ht 65.0 in | Wt 170.6 lb

## 2020-04-01 DIAGNOSIS — E7800 Pure hypercholesterolemia, unspecified: Secondary | ICD-10-CM | POA: Diagnosis not present

## 2020-04-01 DIAGNOSIS — R519 Headache, unspecified: Secondary | ICD-10-CM

## 2020-04-01 DIAGNOSIS — L739 Follicular disorder, unspecified: Secondary | ICD-10-CM | POA: Diagnosis not present

## 2020-04-01 DIAGNOSIS — T50905A Adverse effect of unspecified drugs, medicaments and biological substances, initial encounter: Secondary | ICD-10-CM

## 2020-04-01 MED ORDER — DOXYCYCLINE HYCLATE 100 MG PO TABS: 100.0000 mg | ORAL_TABLET | Freq: Two times a day (BID) | ORAL | 0 refills | Status: DC

## 2020-04-01 NOTE — Progress Notes (Signed)
Patient ID: Abigail Foley, female    DOB: 1961/07/30  Age: 59 y.o. MRN: [id number]  Chief Complaint  Patient presents with  . Medication Problem    Pt stated that she has been experiencing some side effects from Lipitor. She stated that she has been having outbreaks all over her body since taking the Rx and having headaches that mostly in the moring.     Subjective:   Since being on the Lipitor the patient has had some skin outbreaks that she attributes to that.  She gets some little pustules, primarily on the labial areas, but has had some elsewhere.  The may pop and drain and then going away.  She does not have any active lesions today.  She has looked things up and felt like this could be being caused by the Lipitor.  She has not been having muscle aches.  However she does have headaches since being on the Lipitor which was not something she had a lot of before.  Current allergies, medications, problem list, past/family and social histories reviewed.  Objective:  BP 120/76   Pulse 93   Temp 97.7 F (36.5 C) (Temporal)   Ht 5\' 5"  (1.651 m)   Wt 170 lb 9.6 oz (77.4 kg)   SpO2 95%   BMI 28.39 kg/m   Nothing really to examine today.  Assessment & Plan:   Assessment: 1. Folliculitis   2. Elevated cholesterol   3. Medication side effect, initial encounter   4. Headache, unspecified headache type       Plan: See instructions.  No orders of the defined types were placed in this encounter.   No orders of the defined types were placed in this encounter.        Patient Instructions    Decrease the Lipitor to 3 days a week on Monday Wednesday Friday and see how your headaches do.  If you continue to have headaches, stop it for 2 weeks and see if it seems to be attributable to the Lipitor.  If the folliculitis flares up worse, go ahead and take doxycycline 100 mg 1 twice daily for 1 week.  Be careful about sun exposure and photosensitizing effect of the  doxycycline.  If you have worse lesions come up, try to come in while you have an active lesion so we can get a culture from it.   If with time symptoms seem to be doing better, you can try gradually increasing the Lipitor over a couple of months until you are back to the daily dosing of it.  Return if problems.     If you have lab work done today you will be contacted with your lab results within the next 2 weeks.  If you have not heard from [place] then please contact us. The fastest way to get your results is to register for My Chart.   IF you received an x-ray today, you will receive an invoice from [facility]. Please contact [facility] at [phone] with questions or concerns regarding your invoice.   IF you received labwork today, you will receive an invoice from [place]. Please contact [facility] at [phone] with questions or concerns regarding your invoice.   Our billing staff will not be able to assist you with questions regarding bills from these companies.  You will be contacted with the lab results as soon as they are available. The fastest way to get your results is to activate your My Chart account. Instructions are located on  the last page of this paperwork. If you have not heard from [place] regarding the results in 2 weeks, please contact this office.        Return if symptoms worsen or fail to improve, for Since Dr. Nolon Rod is gone, try to arrange a new PCP at the practice.Ruben Reason, MD 04/01/2020

## 2020-04-01 NOTE — Patient Instructions (Addendum)
  Decrease the Lipitor to 3 days a week on Monday Wednesday Friday and see how your headaches do.  If you continue to have headaches, stop it for 2 weeks and see if it seems to be attributable to the Lipitor.  If the folliculitis flares up worse, go ahead and take doxycycline 100 mg 1 twice daily for 1 week.  Be careful about sun exposure and photosensitizing effect of the doxycycline.  If you have worse lesions come up, try to come in while you have an active lesion so we can get a culture from it.   If with time symptoms seem to be doing better, you can try gradually increasing the Lipitor over a couple of months until you are back to the daily dosing of it.  Return if problems.     If you have lab work done today you will be contacted with your lab results within the next 2 weeks.  If you have not heard from [place] then please contact us. The fastest way to get your results is to register for My Chart.   IF you received an x-ray today, you will receive an invoice from [facility]. Please contact [facility] at [phone] with questions or concerns regarding your invoice.   IF you received labwork today, you will receive an invoice from [place]. Please contact [facility] at [phone] with questions or concerns regarding your invoice.   Our billing staff will not be able to assist you with questions regarding bills from these companies.  You will be contacted with the lab results as soon as they are available. The fastest way to get your results is to activate your My Chart account. Instructions are located on the last page of this paperwork. If you have not heard from [place] regarding the results in 2 weeks, please contact this office.

## 2020-06-20 ENCOUNTER — Telehealth: Payer: Self-pay | Admitting: Family Medicine

## 2020-06-20 NOTE — Telephone Encounter (Signed)
Referral followup 

## 2020-11-24 ENCOUNTER — Other Ambulatory Visit: Payer: Self-pay | Admitting: Family Medicine

## 2020-11-24 DIAGNOSIS — Z1231 Encounter for screening mammogram for malignant neoplasm of breast: Secondary | ICD-10-CM

## 2020-12-08 MED FILL — ATORVASTATIN CALCIUM 10 MG: 10 | 90 days supply | Qty: 90 | Fill #1

## 2021-01-18 ENCOUNTER — Other Ambulatory Visit: Payer: Self-pay

## 2021-01-18 ENCOUNTER — Ambulatory Visit
Admission: RE | Admit: 2021-01-18 | Discharge: 2021-01-18 | Disposition: A | Discharge status: Home / Self Care | Payer: 59 | Source: Ambulatory Visit | Attending: Family Medicine | Admitting: Family Medicine

## 2021-01-18 DIAGNOSIS — Z1231 Encounter for screening mammogram for malignant neoplasm of breast: Secondary | ICD-10-CM | POA: Diagnosis not present

## 2021-02-22 ENCOUNTER — Other Ambulatory Visit (HOSPITAL_COMMUNITY): Payer: Self-pay

## 2021-02-22 ENCOUNTER — Ambulatory Visit: Payer: 59 | Admitting: Internal Medicine

## 2021-02-22 ENCOUNTER — Other Ambulatory Visit: Payer: Self-pay

## 2021-02-22 ENCOUNTER — Encounter: Payer: Self-pay | Admitting: Internal Medicine

## 2021-02-22 VITALS — BP 116/80 | HR 69 | Temp 98.3°F | Resp 18 | Ht 65.0 in | Wt 171.2 lb

## 2021-02-22 DIAGNOSIS — B001 Herpesviral vesicular dermatitis: Secondary | ICD-10-CM | POA: Diagnosis not present

## 2021-02-22 DIAGNOSIS — J3089 Other allergic rhinitis: Secondary | ICD-10-CM

## 2021-02-22 DIAGNOSIS — Z8611 Personal history of tuberculosis: Secondary | ICD-10-CM | POA: Diagnosis not present

## 2021-02-22 DIAGNOSIS — Z0000 Encounter for general adult medical examination without abnormal findings: Secondary | ICD-10-CM

## 2021-02-22 LAB — LIPID PANEL
Cholesterol: 161 mg/dL (ref 0–200)
HDL: 54.7 mg/dL (ref >39.00)
LDL Cholesterol: 83 mg/dL (ref 0–99)
NonHDL: 106.73
Total CHOL/HDL Ratio: 3
Triglycerides: 121 mg/dL (ref 0.0–149.0)
VLDL: 24.2 mg/dL (ref 0.0–40.0)

## 2021-02-22 LAB — CBC
HCT: 38 % (ref 36.0–46.0)
Hemoglobin: 12.8 g/dL (ref 12.0–15.0)
MCHC: 33.7 g/dL (ref 30.0–36.0)
MCV: 95.4 fl (ref 78.0–100.0)
Platelets: 250 10*3/uL (ref 150.0–400.0)
RBC: 3.99 Mil/uL (ref 3.87–5.11)
RDW: 13.4 % (ref 11.5–15.5)
WBC: 5.7 10*3/uL (ref 4.0–10.5)

## 2021-02-22 LAB — COMPREHENSIVE METABOLIC PANEL
ALT: 15 U/L (ref 0–35)
AST: 15 U/L (ref 0–37)
Albumin: 4.4 g/dL (ref 3.5–5.2)
Alkaline Phosphatase: 81 U/L (ref 39–117)
BUN: 12 mg/dL (ref 6–23)
CO2: 28 mEq/L (ref 19–32)
Calcium: 9.3 mg/dL (ref 8.4–10.5)
Chloride: 107 mEq/L (ref 96–112)
Creatinine, Ser: 0.69 mg/dL (ref 0.40–1.20)
GFR: 94.2 mL/min (ref >60.00)
Glucose, Bld: 95 mg/dL (ref 70–99)
Potassium: 4.3 mEq/L (ref 3.5–5.1)
Sodium: 142 mEq/L (ref 135–145)
Total Bilirubin: 0.5 mg/dL (ref 0.2–1.2)
Total Protein: 7.1 g/dL (ref 6.0–8.3)

## 2021-02-22 MED ORDER — ATORVASTATIN CALCIUM 10 MG PO TABS
10.0000 mg | ORAL_TABLET | Freq: Every day | ORAL | 3 refills | Status: DC
  Filled 2021-02-22 – 2021-08-14 (×2): qty 90, 90d supply, fill #0

## 2021-02-22 MED ORDER — VALACYCLOVIR HCL 500 MG PO TABS
500.0000 mg | ORAL_TABLET | Freq: Two times a day (BID) | ORAL | 1 refills | Status: DC
  Filled 2021-02-22: qty 90, 45d supply, fill #0

## 2021-02-22 NOTE — Progress Notes (Signed)
   Subjective:   Patient ID: Abigail Foley, female    DOB: 1961-03-31, 60 y.o.   MRN: [id number]  HPI The patient is a new 60 YO female coming in for physical.  PMH, [place], social history reviewed and updated  Review of Systems  Constitutional: Negative.   HENT: Negative.   Eyes: Negative.   Respiratory: Negative for cough, chest tightness and shortness of breath.   Cardiovascular: Negative for chest pain, palpitations and leg swelling.  Gastrointestinal: Negative for abdominal distention, abdominal pain, constipation, diarrhea, nausea and vomiting.  Musculoskeletal: Negative.   Skin: Negative.   Neurological: Negative.   Psychiatric/Behavioral: Negative.     Objective:  Physical Exam Constitutional:      Appearance: She is well-developed.  HENT:     Head: Normocephalic and atraumatic.  Cardiovascular:     Rate and Rhythm: Normal rate and regular rhythm.  Pulmonary:     Effort: Pulmonary effort is normal. No respiratory distress.     Breath sounds: Normal breath sounds. No wheezing or rales.  Abdominal:     General: Bowel sounds are normal. There is no distension.     Palpations: Abdomen is soft.     Tenderness: There is no abdominal tenderness. There is no rebound.  Musculoskeletal:     Cervical back: Normal range of motion.  Skin:    General: Skin is warm and dry.  Neurological:     Mental Status: She is alert and oriented to person, place, and time.     Coordination: Coordination normal.     Vitals:   02/22/21 0803  BP: 116/80  Pulse: 69  Resp: 18  Temp: 98.3 F (36.8 C)  TempSrc: Oral  SpO2: 98%  Weight: 171 lb 3.2 oz (77.7 kg)  Height: 5\' 5"  (1.651 m)   EKG: Rate 61, axis normal, interval normal, sinus, no st or t wave changes, no prior to compare   This visit occurred during the SARS-CoV-2 public health emergency.  Safety protocols were in place, including screening questions prior to the visit, additional usage of staff PPE, and extensive  cleaning of exam room while observing appropriate contact time as indicated for disinfecting solutions.   Assessment & Plan:

## 2021-02-22 NOTE — Patient Instructions (Addendum)
Think about getting the shingles vaccine. You are eligible for another covid-19 booster if you want that. The EKG of the heart was normal.  Health Maintenance, Female Adopting a healthy lifestyle and getting preventive care are important in promoting health and wellness. Ask your health care provider about:  The right schedule for you to have regular tests and exams.  Things you can do on your own to prevent diseases and keep yourself healthy. What should I know about diet, weight, and exercise? Eat a healthy diet  Eat a diet that includes plenty of vegetables, fruits, low-fat dairy products, and lean protein.  Do not eat a lot of foods that are high in solid fats, added sugars, or sodium.   Maintain a healthy weight Body mass index (BMI) is used to identify weight problems. It estimates body fat based on height and weight. Your health care provider can help determine your BMI and help you achieve or maintain a healthy weight. Get regular exercise Get regular exercise. This is one of the most important things you can do for your health. Most adults should:  Exercise for at least 150 minutes each week. The exercise should increase your heart rate and make you sweat (moderate-intensity exercise).  Do strengthening exercises at least twice a week. This is in addition to the moderate-intensity exercise.  Spend less time sitting. Even light physical activity can be beneficial. Watch cholesterol and blood lipids Have your blood tested for lipids and cholesterol at 61 years of age, then have this test every 5 years. Have your cholesterol levels checked more often if:  Your lipid or cholesterol levels are high.  You are older than 61 years of age.  You are at high risk for heart disease. What should I know about cancer screening? Depending on your health history and family history, you may need to have cancer screening at various ages. This may include screening for:  Breast  cancer.  Cervical cancer.  Colorectal cancer.  Skin cancer.  Lung cancer. What should I know about heart disease, diabetes, and high blood pressure? Blood pressure and heart disease  High blood pressure causes heart disease and increases the risk of stroke. This is more likely to develop in people who have high blood pressure readings, are of African descent, or are overweight.  Have your blood pressure checked: ? Every 3-5 years if you are 9-22 years of age. ? Every year if you are 15 years old or older. Diabetes Have regular diabetes screenings. This checks your fasting blood sugar level. Have the screening done:  Once every three years after age 67 if you are at a normal weight and have a low risk for diabetes.  More often and at a younger age if you are overweight or have a high risk for diabetes. What should I know about preventing infection? Hepatitis B If you have a higher risk for hepatitis B, you should be screened for this virus. Talk with your health care provider to find out if you are at risk for hepatitis B infection. Hepatitis C Testing is recommended for:  Everyone born from [birth year] through 1966.  Anyone with known risk factors for hepatitis C. Sexually transmitted infections (STIs)  Get screened for STIs, including gonorrhea and chlamydia, if: ? You are sexually active and are younger than 61 years of age. ? You are older than 61 years of age and your health care provider tells you that you are at risk for this type of infection. ?  Your sexual activity has changed since you were last screened, and you are at increased risk for chlamydia or gonorrhea. Ask your health care provider if you are at risk.  Ask your health care provider about whether you are at high risk for HIV. Your health care provider may recommend a prescription medicine to help prevent HIV infection. If you choose to take medicine to prevent HIV, you should first get tested for HIV. You should  then be tested every 3 months for as long as you are taking the medicine. Pregnancy  If you are about to stop having your period (premenopausal) and you may become pregnant, seek counseling before you get pregnant.  Take 400 to 800 micrograms (mcg) of folic acid every day if you become pregnant.  Ask for birth control (contraception) if you want to prevent pregnancy. Osteoporosis and menopause Osteoporosis is a disease in which the bones lose minerals and strength with aging. This can result in bone fractures. If you are 73 years old or older, or if you are at risk for osteoporosis and fractures, ask your health care provider if you should:  Be screened for bone loss.  Take a calcium or vitamin D supplement to lower your risk of fractures.  Be given hormone replacement therapy (HRT) to treat symptoms of menopause. Follow these instructions at home: Lifestyle  Do not use any products that contain nicotine or tobacco, such as cigarettes, e-cigarettes, and chewing tobacco. If you need help quitting, ask your health care provider.  Do not use street drugs.  Do not share needles.  Ask your health care provider for help if you need support or information about quitting drugs. Alcohol use  Do not drink alcohol if: ? Your health care provider tells you not to drink. ? You are pregnant, may be pregnant, or are planning to become pregnant.  If you drink alcohol: ? Limit how much you use to 0-1 drink a day. ? Limit intake if you are breastfeeding.  Be aware of how much alcohol is in your drink. In the [place] one drink equals one 12 oz bottle of beer (355 mL), one 5 oz glass of wine (148 mL), or one 1 oz glass of hard liquor (44 mL). General instructions  Schedule regular health, dental, and eye exams.  Stay current with your vaccines.  Tell your health care provider if: ? You often feel depressed. ? You have ever been abused or do not feel safe at home. Summary  Adopting a  healthy lifestyle and getting preventive care are important in promoting health and wellness.  Follow your health care provider's instructions about healthy diet, exercising, and getting tested or screened for diseases.  Follow your health care provider's instructions on monitoring your cholesterol and blood pressure. This information is not intended to replace advice given to you by your health care provider. Make sure you discuss any questions you have with your health care provider. Document Revised: 09/10/2018 Document Reviewed: 09/10/2018 Elsevier Patient Education  2021 [employer].

## 2021-02-24 DIAGNOSIS — B001 Herpesviral vesicular dermatitis: Secondary | ICD-10-CM | POA: Insufficient documentation

## 2021-02-24 DIAGNOSIS — Z0000 Encounter for general adult medical examination without abnormal findings: Secondary | ICD-10-CM | POA: Insufficient documentation

## 2021-02-24 NOTE — Assessment & Plan Note (Signed)
Prior skin testing positive and treated with medications years ago.

## 2021-02-24 NOTE — Assessment & Plan Note (Signed)
Valtrex prn refilled today.

## 2021-02-24 NOTE — Assessment & Plan Note (Signed)
Flu shot yearly. Covid-19 counseled about boosters. Shingrix counseled declines. Tetanus up to date. Colonoscopy up to date. Mammogram up to date, pap smear up to date. Counseled about sun safety and mole surveillance. Counseled about the dangers of distracted driving. Given 10 year screening recommendations.

## 2021-02-24 NOTE — Assessment & Plan Note (Signed)
Takes claritin.

## 2021-03-03 ENCOUNTER — Other Ambulatory Visit (HOSPITAL_COMMUNITY): Payer: Self-pay

## 2021-03-28 ENCOUNTER — Encounter: Payer: Self-pay | Admitting: Internal Medicine

## 2021-03-28 DIAGNOSIS — H2513 Age-related nuclear cataract, bilateral: Secondary | ICD-10-CM | POA: Diagnosis not present

## 2021-03-28 DIAGNOSIS — H40013 Open angle with borderline findings, low risk, bilateral: Secondary | ICD-10-CM | POA: Diagnosis not present

## 2021-03-28 DIAGNOSIS — H25013 Cortical age-related cataract, bilateral: Secondary | ICD-10-CM | POA: Diagnosis not present

## 2021-03-28 DIAGNOSIS — R7309 Other abnormal glucose: Secondary | ICD-10-CM | POA: Diagnosis not present

## 2021-05-01 ENCOUNTER — Encounter: Payer: Self-pay | Admitting: Internal Medicine

## 2021-06-15 ENCOUNTER — Other Ambulatory Visit: Payer: Self-pay

## 2021-06-15 ENCOUNTER — Encounter: Payer: Self-pay | Admitting: Physician Assistant

## 2021-06-15 ENCOUNTER — Ambulatory Visit (INDEPENDENT_AMBULATORY_CARE_PROVIDER_SITE_OTHER): Payer: 59 | Admitting: Physician Assistant

## 2021-06-15 DIAGNOSIS — L570 Actinic keratosis: Secondary | ICD-10-CM

## 2021-06-15 DIAGNOSIS — Z1283 Encounter for screening for malignant neoplasm of skin: Secondary | ICD-10-CM | POA: Diagnosis not present

## 2021-06-19 ENCOUNTER — Encounter: Payer: Self-pay | Admitting: Physician Assistant

## 2021-06-19 NOTE — Progress Notes (Signed)
   New Patient   Subjective  Abigail Foley is a 60 y.o. female who presents for the following: Annual Exam (No new concerns- Maybe some dry spots on back - + itch. No personal history of melanoma or non mole skin cancers. Unsure of any family history of melanoma or non mole skin cancers. ).   The following portions of the chart were reviewed this encounter and updated as appropriate:  Tobacco  Allergies  Meds  Problems  Med Hx  Surg Hx  Fam Hx      Objective  Well appearing patient in no apparent distress; mood and affect are within normal limits.  A full examination was performed including scalp, head, eyes, ears, nose, lips, neck, chest, axillae, abdomen, back, buttocks, bilateral upper extremities, bilateral lower extremities, hands, feet, fingers, toes, fingernails, and toenails. All findings within normal limits unless otherwise noted below.  right cheek Erythematous patches with gritty scale.   Assessment & Plan  AK (actinic keratosis) right cheek  Destruction of lesion - right cheek Complexity: simple   Destruction method: cryotherapy   Informed consent: discussed and consent obtained   Timeout:  patient name, date of birth, surgical site, and procedure verified Lesion destroyed using liquid nitrogen: Yes   Cryotherapy cycles:  3 Outcome: patient tolerated procedure well with no complications   Post-procedure details: wound care instructions given       I, Chrishun Scheer, PA-C, have reviewed all documentation's for this visit.  The documentation on 06/19/21 for the exam, diagnosis, procedures and orders are all accurate and complete.

## 2021-08-14 ENCOUNTER — Other Ambulatory Visit (HOSPITAL_COMMUNITY): Payer: Self-pay

## 2022-01-15 ENCOUNTER — Other Ambulatory Visit: Payer: Self-pay | Admitting: Internal Medicine

## 2022-01-15 DIAGNOSIS — Z1231 Encounter for screening mammogram for malignant neoplasm of breast: Secondary | ICD-10-CM

## 2022-02-08 ENCOUNTER — Ambulatory Visit
Admission: RE | Admit: 2022-02-08 | Discharge: 2022-02-08 | Disposition: A | Discharge status: Home / Self Care | Payer: 59 | Source: Ambulatory Visit

## 2022-02-08 DIAGNOSIS — Z1231 Encounter for screening mammogram for malignant neoplasm of breast: Secondary | ICD-10-CM | POA: Diagnosis not present

## 2022-03-01 ENCOUNTER — Ambulatory Visit (INDEPENDENT_AMBULATORY_CARE_PROVIDER_SITE_OTHER): Payer: 59 | Admitting: Internal Medicine

## 2022-03-01 ENCOUNTER — Other Ambulatory Visit (HOSPITAL_COMMUNITY): Payer: Self-pay

## 2022-03-01 ENCOUNTER — Encounter: Payer: Self-pay | Admitting: Internal Medicine

## 2022-03-01 VITALS — BP 112/74 | HR 63 | Resp 18 | Ht 65.0 in | Wt 171.8 lb

## 2022-03-01 DIAGNOSIS — Z0000 Encounter for general adult medical examination without abnormal findings: Secondary | ICD-10-CM

## 2022-03-01 DIAGNOSIS — B001 Herpesviral vesicular dermatitis: Secondary | ICD-10-CM

## 2022-03-01 DIAGNOSIS — E782 Mixed hyperlipidemia: Secondary | ICD-10-CM | POA: Diagnosis not present

## 2022-03-01 DIAGNOSIS — E785 Hyperlipidemia, unspecified: Secondary | ICD-10-CM | POA: Insufficient documentation

## 2022-03-01 LAB — CBC
HCT: 37.6 % (ref 36.0–46.0)
Hemoglobin: 12.9 g/dL (ref 12.0–15.0)
MCHC: 34.3 g/dL (ref 30.0–36.0)
MCV: 95.3 fl (ref 78.0–100.0)
Platelets: 257 10*3/uL (ref 150.0–400.0)
RBC: 3.95 Mil/uL (ref 3.87–5.11)
RDW: 13.2 % (ref 11.5–15.5)
WBC: 6.1 10*3/uL (ref 4.0–10.5)

## 2022-03-01 LAB — COMPREHENSIVE METABOLIC PANEL
ALT: 22 U/L (ref 0–35)
AST: 20 U/L (ref 0–37)
Albumin: 4.4 g/dL (ref 3.5–5.2)
Alkaline Phosphatase: 77 U/L (ref 39–117)
BUN: 12 mg/dL (ref 6–23)
CO2: 26 mEq/L (ref 19–32)
Calcium: 9.6 mg/dL (ref 8.4–10.5)
Chloride: 106 mEq/L (ref 96–112)
Creatinine, Ser: 0.78 mg/dL (ref 0.40–1.20)
GFR: 81.85 mL/min (ref >60.00)
Glucose, Bld: 98 mg/dL (ref 70–99)
Potassium: 4.2 mEq/L (ref 3.5–5.1)
Sodium: 142 mEq/L (ref 135–145)
Total Bilirubin: 0.6 mg/dL (ref 0.2–1.2)
Total Protein: 7.2 g/dL (ref 6.0–8.3)

## 2022-03-01 LAB — LIPID PANEL
Cholesterol: 176 mg/dL (ref 0–200)
HDL: 56.7 mg/dL (ref >39.00)
LDL Cholesterol: 90 mg/dL (ref 0–99)
NonHDL: 119.36
Total CHOL/HDL Ratio: 3
Triglycerides: 145 mg/dL (ref 0.0–149.0)
VLDL: 29 mg/dL (ref 0.0–40.0)

## 2022-03-01 MED ORDER — CHLORHEXIDINE GLUCONATE 4 % EX LIQD
CUTANEOUS | 0 refills | Status: DC
  Filled 2022-03-01: qty 120, fill #0

## 2022-03-01 NOTE — Assessment & Plan Note (Signed)
Checking lipid panel and adjust as needed. Not taking medication currently.

## 2022-03-01 NOTE — Progress Notes (Signed)
   Subjective:   Patient ID: Abigail Foley, female    DOB: 01-26-1961, 61 y.o.   MRN: [id number]  HPI The patient is here for physical.  PMH, [facility], social history reviewed and updated  Review of Systems  Constitutional: Negative.   HENT: Negative.    Eyes: Negative.   Respiratory:  Negative for cough, chest tightness and shortness of breath.   Cardiovascular:  Negative for chest pain, palpitations and leg swelling.  Gastrointestinal:  Negative for abdominal distention, abdominal pain, constipation, diarrhea, nausea and vomiting.  Musculoskeletal: Negative.   Skin: Negative.   Neurological: Negative.   Psychiatric/Behavioral: Negative.     Objective:  Physical Exam Constitutional:      Appearance: She is well-developed.  HENT:     Head: Normocephalic and atraumatic.  Cardiovascular:     Rate and Rhythm: Normal rate and regular rhythm.  Pulmonary:     Effort: Pulmonary effort is normal. No respiratory distress.     Breath sounds: Normal breath sounds. No wheezing or rales.  Abdominal:     General: Bowel sounds are normal. There is no distension.     Palpations: Abdomen is soft.     Tenderness: There is no abdominal tenderness. There is no rebound.  Musculoskeletal:     Cervical back: Normal range of motion.  Skin:    General: Skin is warm and dry.  Neurological:     Mental Status: She is alert and oriented to person, place, and time.     Coordination: Coordination normal.    Vitals:   03/01/22 0852  BP: 112/74  Pulse: 63  Resp: 18  SpO2: 99%  Weight: 171 lb 12.8 oz (77.9 kg)  Height: '5\' 5"'$  (1.651 m)    This visit occurred during the SARS-CoV-2 public health emergency.  Safety protocols were in place, including screening questions prior to the visit, additional usage of staff PPE, and extensive cleaning of exam room while observing appropriate contact time as indicated for disinfecting solutions.   Assessment & Plan:

## 2022-03-01 NOTE — Assessment & Plan Note (Signed)
Flu shot yearly. Covid-19 counseled. Shingrix declines. Tetanus up to date. Colonoscopy up to date. Mammogram up to date, pap smear up to date. Counseled about sun safety and mole surveillance. Counseled about the dangers of distracted driving. Given 10 year screening recommendations.

## 2022-03-01 NOTE — Assessment & Plan Note (Signed)
Valtrex taken prn and can refill when needed. No flare today.

## 2022-03-01 NOTE — Patient Instructions (Signed)
We will check the labs today. Think about the shingles vaccine.

## 2022-05-02 ENCOUNTER — Other Ambulatory Visit: Payer: Self-pay | Admitting: Internal Medicine

## 2022-05-03 ENCOUNTER — Other Ambulatory Visit (HOSPITAL_COMMUNITY): Payer: Self-pay

## 2022-05-03 MED ORDER — ATORVASTATIN CALCIUM 10 MG PO TABS
10.0000 mg | ORAL_TABLET | Freq: Every day | ORAL | 3 refills | Status: DC
  Filled 2022-05-03: qty 90, 90d supply, fill #0

## 2022-06-21 ENCOUNTER — Ambulatory Visit: Payer: 59 | Admitting: Physician Assistant

## 2022-07-17 ENCOUNTER — Ambulatory Visit: Payer: 59 | Admitting: Physician Assistant

## 2022-07-18 ENCOUNTER — Ambulatory Visit: Payer: 59 | Admitting: Physician Assistant

## 2022-11-05 ENCOUNTER — Other Ambulatory Visit: Payer: Self-pay | Admitting: Internal Medicine

## 2022-11-05 DIAGNOSIS — Z1231 Encounter for screening mammogram for malignant neoplasm of breast: Secondary | ICD-10-CM

## 2022-11-20 ENCOUNTER — Other Ambulatory Visit: Payer: Self-pay

## 2022-11-20 ENCOUNTER — Encounter: Payer: Self-pay | Admitting: Gastroenterology

## 2022-11-20 ENCOUNTER — Ambulatory Visit: Payer: 59 | Admitting: Internal Medicine

## 2022-11-20 ENCOUNTER — Encounter: Payer: Self-pay | Admitting: Internal Medicine

## 2022-11-20 ENCOUNTER — Other Ambulatory Visit (HOSPITAL_COMMUNITY): Payer: Self-pay

## 2022-11-20 VITALS — BP 120/64 | HR 72 | Temp 98.3°F | Ht 65.0 in | Wt 173.0 lb

## 2022-11-20 DIAGNOSIS — E663 Overweight: Secondary | ICD-10-CM | POA: Diagnosis not present

## 2022-11-20 MED ORDER — ATORVASTATIN CALCIUM 10 MG PO TABS
10.0000 mg | ORAL_TABLET | Freq: Every day | ORAL | 3 refills | Status: DC
  Filled 2022-11-20: qty 90, 90d supply, fill #0
  Filled 2023-02-07: qty 90, 90d supply, fill #1

## 2022-11-20 MED ORDER — SEMAGLUTIDE-WEIGHT MANAGEMENT 2.4 MG/0.75ML ~~LOC~~ SOAJ
2.4000 mg | SUBCUTANEOUS | 0 refills | Status: DC
  Filled 2022-11-20: qty 3, 28d supply, fill #0

## 2022-11-20 MED ORDER — SEMAGLUTIDE-WEIGHT MANAGEMENT 0.5 MG/0.5ML ~~LOC~~ SOAJ
0.5000 mg | SUBCUTANEOUS | 0 refills | Status: AC
  Filled 2022-11-20 – 2023-01-02 (×3): qty 2, 28d supply, fill #0

## 2022-11-20 MED ORDER — SEMAGLUTIDE-WEIGHT MANAGEMENT 0.25 MG/0.5ML ~~LOC~~ SOAJ
0.2500 mg | SUBCUTANEOUS | 0 refills | Status: AC
  Filled 2022-11-20 – 2022-11-29 (×2): qty 2, 28d supply, fill #0

## 2022-11-20 MED ORDER — BUPROPION HCL ER (XL) 150 MG PO TB24
150.0000 mg | ORAL_TABLET | Freq: Every day | ORAL | 1 refills | Status: DC
Start: 2022-11-20 — End: 2023-04-23
  Filled 2022-11-20: qty 90, 90d supply, fill #0
  Filled 2023-02-16: qty 90, 90d supply, fill #1

## 2022-11-20 MED ORDER — SEMAGLUTIDE-WEIGHT MANAGEMENT 1.7 MG/0.75ML ~~LOC~~ SOAJ
1.7000 mg | SUBCUTANEOUS | 0 refills | Status: AC
  Filled 2022-11-20: qty 3, 28d supply, fill #0

## 2022-11-20 MED ORDER — SEMAGLUTIDE-WEIGHT MANAGEMENT 1 MG/0.5ML ~~LOC~~ SOAJ
1.0000 mg | SUBCUTANEOUS | 0 refills | Status: AC
  Filled 2022-11-20 – 2023-01-30 (×2): qty 2, 28d supply, fill #0

## 2022-11-20 NOTE — Patient Instructions (Signed)
We have sent in the wegovy and the wellbutrin to help with the weight.

## 2022-11-20 NOTE — Assessment & Plan Note (Addendum)
She has struggled to lose weight on her own and weight is stable to mildly up in last 1-2 years. Rx wellbutrin 150 mg daily (she has recently quit smoking and this will help avoid recurrence) and wegovy (she understands this will not be covered but wants to use HSA to pay for this). Follow up at physical on weight in 4-5 months. Counseled about common side effects and risk/benefit.

## 2022-11-20 NOTE — Progress Notes (Signed)
   Subjective:   Patient ID: Abigail Foley, female    DOB: 1961/07/24, 62 y.o.   MRN: [id number]  HPI The patient is a 62 YO female coming in for weight management. Has tried losing weight on her own.  Review of Systems  Constitutional: Negative.   HENT: Negative.    Eyes: Negative.   Respiratory:  Negative for cough, chest tightness and shortness of breath.   Cardiovascular:  Negative for chest pain, palpitations and leg swelling.  Gastrointestinal:  Negative for abdominal distention, abdominal pain, constipation, diarrhea, nausea and vomiting.  Musculoskeletal: Negative.   Skin: Negative.   Neurological: Negative.   Psychiatric/Behavioral: Negative.      Objective:  Physical Exam Constitutional:      Appearance: She is well-developed.  HENT:     Head: Normocephalic and atraumatic.  Cardiovascular:     Rate and Rhythm: Normal rate and regular rhythm.  Pulmonary:     Effort: Pulmonary effort is normal. No respiratory distress.     Breath sounds: Normal breath sounds. No wheezing or rales.  Abdominal:     General: Bowel sounds are normal. There is no distension.     Palpations: Abdomen is soft.     Tenderness: There is no abdominal tenderness. There is no rebound.  Musculoskeletal:     Cervical back: Normal range of motion.  Skin:    General: Skin is warm and dry.  Neurological:     Mental Status: She is alert and oriented to person, place, and time.     Coordination: Coordination normal.     Vitals:   11/20/22 1433  BP: 120/64  Pulse: 72  Temp: 98.3 F (36.8 C)  TempSrc: Oral  SpO2: 98%  Weight: 173 lb (78.5 kg)  Height: 5' 5"$  (1.651 m)    Assessment & Plan:

## 2022-11-21 ENCOUNTER — Other Ambulatory Visit: Payer: Self-pay

## 2022-11-23 ENCOUNTER — Other Ambulatory Visit (HOSPITAL_COMMUNITY): Payer: Self-pay

## 2022-11-29 ENCOUNTER — Other Ambulatory Visit: Payer: Self-pay

## 2022-11-29 ENCOUNTER — Other Ambulatory Visit (HOSPITAL_COMMUNITY): Payer: Self-pay

## 2022-11-30 ENCOUNTER — Other Ambulatory Visit (HOSPITAL_COMMUNITY): Payer: Self-pay

## 2022-12-01 ENCOUNTER — Other Ambulatory Visit (HOSPITAL_COMMUNITY): Payer: Self-pay

## 2022-12-04 ENCOUNTER — Other Ambulatory Visit (HOSPITAL_COMMUNITY): Payer: Self-pay

## 2022-12-27 ENCOUNTER — Other Ambulatory Visit (HOSPITAL_COMMUNITY): Payer: Self-pay

## 2022-12-27 ENCOUNTER — Other Ambulatory Visit: Payer: Self-pay

## 2022-12-28 ENCOUNTER — Other Ambulatory Visit (HOSPITAL_COMMUNITY): Payer: Self-pay

## 2023-01-01 ENCOUNTER — Encounter: Payer: Self-pay | Admitting: Physician Assistant

## 2023-01-01 ENCOUNTER — Other Ambulatory Visit: Payer: Self-pay

## 2023-01-02 ENCOUNTER — Other Ambulatory Visit (HOSPITAL_COMMUNITY): Payer: Self-pay

## 2023-01-10 ENCOUNTER — Telehealth: Payer: Self-pay | Admitting: Pharmacy Technician

## 2023-01-10 NOTE — Telephone Encounter (Signed)
Patient Advocate Encounter   Received notification that prior authorization for [facility] 0.5MG /0.5ML auto-injectors is required.   PA submitted on 01/10/2023 Key [id number] Insurance [employer] ePA Form 2017 NCPDP Status is pending

## 2023-01-14 ENCOUNTER — Encounter: Payer: Self-pay | Admitting: Internal Medicine

## 2023-01-14 ENCOUNTER — Other Ambulatory Visit (HOSPITAL_COMMUNITY): Payer: Self-pay

## 2023-01-15 NOTE — Telephone Encounter (Signed)
Called pt made ov tomorrow @ 11:00.Marland KitchenRaechel Foley

## 2023-01-16 ENCOUNTER — Ambulatory Visit: Payer: 59 | Admitting: Internal Medicine

## 2023-01-16 ENCOUNTER — Encounter: Payer: Self-pay | Admitting: Internal Medicine

## 2023-01-16 VITALS — BP 100/80 | HR 62 | Temp 98.4°F | Ht 65.0 in | Wt 158.0 lb

## 2023-01-16 DIAGNOSIS — N6314 Unspecified lump in the right breast, lower inner quadrant: Secondary | ICD-10-CM | POA: Diagnosis not present

## 2023-01-16 NOTE — Progress Notes (Signed)
   Subjective:   Patient ID: Abigail Foley, female    DOB: 10/06/1960, 62 y.o.   MRN: [id number]  HPI The patient is a 62 YO female coming in for right breast lump noticed this weekend. Does self checks at least every few months was not there previously. Sister with breast cancer.   Review of Systems  Constitutional: Negative.   HENT: Negative.    Eyes: Negative.   Respiratory:  Negative for cough, chest tightness and shortness of breath.   Cardiovascular:  Negative for chest pain, palpitations and leg swelling.       Breast lump  Gastrointestinal:  Negative for abdominal distention, abdominal pain, constipation, diarrhea, nausea and vomiting.  Musculoskeletal: Negative.   Skin: Negative.   Neurological: Negative.   Psychiatric/Behavioral: Negative.      Objective:  Physical Exam Constitutional:      Appearance: She is well-developed.  HENT:     Head: Normocephalic and atraumatic.  Cardiovascular:     Rate and Rhythm: Normal rate and regular rhythm.  Pulmonary:     Effort: Pulmonary effort is normal. No respiratory distress.     Breath sounds: Normal breath sounds. No wheezing or rales.     Comments: Breast exam left normal, right with 1cm circular freely mobile area at 6 o clock. No axillary LAD detected Abdominal:     General: Bowel sounds are normal. There is no distension.     Palpations: Abdomen is soft.     Tenderness: There is no abdominal tenderness. There is no rebound.  Musculoskeletal:     Cervical back: Normal range of motion.  Skin:    General: Skin is warm and dry.  Neurological:     Mental Status: She is alert and oriented to person, place, and time.     Coordination: Coordination normal.     Vitals:   01/16/23 1105  BP: 100/80  Pulse: 62  Temp: 98.4 F (36.9 C)  TempSrc: Oral  SpO2: 95%  Weight: 158 lb (71.7 kg)  Height:  (1.651 m)    Assessment & Plan:

## 2023-01-16 NOTE — Patient Instructions (Signed)
We will get the mammogram and ultrasound if needed to check this spot.

## 2023-01-16 NOTE — Assessment & Plan Note (Signed)
Diagnostic mammogram and ultrasound ordered to assess.

## 2023-01-19 NOTE — Telephone Encounter (Signed)
PA Denied- Full letter in Media   

## 2023-01-30 ENCOUNTER — Other Ambulatory Visit (HOSPITAL_COMMUNITY): Payer: Self-pay

## 2023-01-31 ENCOUNTER — Other Ambulatory Visit (HOSPITAL_COMMUNITY): Payer: Self-pay

## 2023-02-01 ENCOUNTER — Other Ambulatory Visit: Payer: 59

## 2023-02-07 ENCOUNTER — Other Ambulatory Visit (HOSPITAL_COMMUNITY): Payer: Self-pay

## 2023-02-12 ENCOUNTER — Ambulatory Visit
Admission: RE | Admit: 2023-02-12 | Discharge: 2023-02-12 | Disposition: A | Discharge status: Home / Self Care | Payer: 59 | Source: Ambulatory Visit | Attending: Internal Medicine | Admitting: Internal Medicine

## 2023-02-12 ENCOUNTER — Other Ambulatory Visit: Payer: Self-pay | Admitting: Internal Medicine

## 2023-02-12 DIAGNOSIS — N6314 Unspecified lump in the right breast, lower inner quadrant: Secondary | ICD-10-CM

## 2023-02-12 DIAGNOSIS — N63 Unspecified lump in unspecified breast: Secondary | ICD-10-CM | POA: Diagnosis not present

## 2023-02-12 DIAGNOSIS — Z1231 Encounter for screening mammogram for malignant neoplasm of breast: Secondary | ICD-10-CM

## 2023-02-12 DIAGNOSIS — R921 Mammographic calcification found on diagnostic imaging of breast: Secondary | ICD-10-CM

## 2023-02-14 ENCOUNTER — Encounter: Payer: Self-pay | Admitting: Dermatology

## 2023-02-14 ENCOUNTER — Ambulatory Visit (INDEPENDENT_AMBULATORY_CARE_PROVIDER_SITE_OTHER): Payer: 59 | Admitting: Dermatology

## 2023-02-14 ENCOUNTER — Other Ambulatory Visit (HOSPITAL_COMMUNITY): Payer: Self-pay

## 2023-02-14 VITALS — BP 95/62

## 2023-02-14 DIAGNOSIS — X32XXXA Exposure to sunlight, initial encounter: Secondary | ICD-10-CM

## 2023-02-14 DIAGNOSIS — L821 Other seborrheic keratosis: Secondary | ICD-10-CM

## 2023-02-14 DIAGNOSIS — Z872 Personal history of diseases of the skin and subcutaneous tissue: Secondary | ICD-10-CM

## 2023-02-14 DIAGNOSIS — D2371 Other benign neoplasm of skin of right lower limb, including hip: Secondary | ICD-10-CM

## 2023-02-14 DIAGNOSIS — L814 Other melanin hyperpigmentation: Secondary | ICD-10-CM | POA: Diagnosis not present

## 2023-02-14 DIAGNOSIS — Z1283 Encounter for screening for malignant neoplasm of skin: Secondary | ICD-10-CM | POA: Diagnosis not present

## 2023-02-14 DIAGNOSIS — L570 Actinic keratosis: Secondary | ICD-10-CM

## 2023-02-14 DIAGNOSIS — D1801 Hemangioma of skin and subcutaneous tissue: Secondary | ICD-10-CM | POA: Diagnosis not present

## 2023-02-14 DIAGNOSIS — L578 Other skin changes due to chronic exposure to nonionizing radiation: Secondary | ICD-10-CM

## 2023-02-14 DIAGNOSIS — L720 Epidermal cyst: Secondary | ICD-10-CM | POA: Diagnosis not present

## 2023-02-14 DIAGNOSIS — W908XXA Exposure to other nonionizing radiation, initial encounter: Secondary | ICD-10-CM

## 2023-02-14 MED ORDER — DOXYCYCLINE HYCLATE 100 MG PO CAPS
100.0000 mg | ORAL_CAPSULE | Freq: Two times a day (BID) | ORAL | 1 refills | Status: AC
  Filled 2023-02-14 – 2023-02-19 (×4): qty 10, 5d supply, fill #0

## 2023-02-14 NOTE — Progress Notes (Signed)
   New Patient Visit   Subjective  Abigail Foley is a 62 y.o. female who presents for the following: Skin Cancer Screening and Full Body Skin Exam  The patient presents for Total-Body Skin Exam (TBSE) for skin cancer screening and mole check. The patient has spots, moles and lesions to be evaluated, some may be new or changing and the patient has concerns that these could be cancer.  Her last skin exam was 1-2 years ago. She has not had a skin cancer. Past history of Actinic Keratoses. Family history is unknown. She has a growth on the right mid toe. It has been there for about 3 years. It has not been treated. Prior patient of Dr. Jorja Loa.  She wants to discuss cystic growths at bikini area for a few years. She is using hydrocortisone cream, epsom salt soaks, witch hazel and Hibiclens  The following portions of the chart were reviewed this encounter and updated as appropriate: medications, allergies, medical history  Review of Systems:  No other skin or systemic complaints except as noted in HPI or Assessment and Plan.  Objective  Well appearing patient in no apparent distress; mood and affect are within normal limits.  A full examination was performed including scalp, head, eyes, ears, nose, lips, neck, chest, axillae, abdomen, back, buttocks, bilateral upper extremities, bilateral lower extremities, hands, feet, fingers, toes, fingernails, and toenails. All findings within normal limits unless otherwise noted below.   Relevant physical exam findings are noted in the Assessment and Plan.  Right eyebrow, right forehead (2) Scaly plaques    Assessment & Plan   LENTIGINES, SEBORRHEIC KERATOSES, HEMANGIOMAS - Benign normal skin lesions - Benign-appearing - Call for any changes  MELANOCYTIC NEVI - Tan-brown and/or pink-flesh-colored symmetric macules and papules - Benign appearing on exam today - Observation - Call clinic for new or changing moles - Recommend daily use of broad  spectrum spf 30+ sunscreen to sun-exposed areas.   ACTINIC DAMAGE - Chronic condition, secondary to cumulative UV/sun exposure - diffuse scaly erythematous macules with underlying dyspigmentation - Recommend daily broad spectrum sunscreen SPF 30+ to sun-exposed areas, reapply every 2 hours as needed.  - Staying in the shade or wearing long sleeves, sun glasses (UVA+UVB protection) and wide brim hats (4-inch brim around the entire circumference of the hat) are also recommended for sun protection.  - Call for new or changing lesions.  Inflamed cysts Exam: inflamed papules in bikini area  Treatment Plan: Doxycycline 100mg  twice daily with food for 5 days as needed  Advised BPO cleanser daily  Benign Neoplasm Exam: papule on the right 3rd toe  Treatment Plan: Reassurance/Benign   SKIN CANCER SCREENING PERFORMED TODAY.    Actinic keratosis (2) Right eyebrow, right forehead  Destruction of lesion - Right eyebrow, right forehead Complexity: simple   Destruction method: cryotherapy   Informed consent: discussed and consent obtained   Timeout:  patient name, date of birth, surgical site, and procedure verified Lesion destroyed using liquid nitrogen: Yes   Region frozen until ice ball extended beyond lesion: Yes   Outcome: patient tolerated procedure well with no complications   Post-procedure details: wound care instructions given      Return in about 1 year (around 02/14/2024) for TBSE.  Jaclynn Guarneri, CMA, am acting as scribe for [employer], DO.   Documentation: I have reviewed the above documentation for accuracy and completeness, and I agree with the above.  Langston Reusing, DO

## 2023-02-14 NOTE — Patient Instructions (Addendum)
Cryotherapy Aftercare  Wash gently with soap and water everyday.   Apply Vaseline and Band-Aid daily until healed.    Due to recent changes in healthcare laws, you may see results of your pathology and/or laboratory studies on MyChart before the doctors have had a chance to review them. We understand that in some cases there may be results that are confusing or concerning to you. Please understand that not all results are received at the same time and often the doctors may need to interpret multiple results in order to provide you with the best plan of care or course of treatment. Therefore, we ask that you please give [place] 2 business days to thoroughly review all your results before contacting the office for clarification. Should we see a critical lab result, you will be contacted sooner.   If You Need Anything After Your Visit  If you have any questions or concerns for your doctor, please call our main line at [phone] If no one answers, please leave a voicemail as directed and we will return your call as soon as possible. Messages left after 4 pm will be answered the following business day.   You may also send [place] a message via MyChart. We typically respond to MyChart messages within 1-2 business days.  For prescription refills, please ask your pharmacy to contact our office. Our fax number is [phone].  If you have an urgent issue when the clinic is closed that cannot wait until the next business day, you can page your doctor at the number below.    Please note that while we do our best to be available for urgent issues outside of office hours, we are not available 24/7.   If you have an urgent issue and are unable to reach [place], you may choose to seek medical care at your doctor's office, retail clinic, urgent care center, or emergency room.  If you have a medical emergency, please immediately call 911 or go to the emergency department. In the event of inclement weather, please  call our main line at [phone] for an update on the status of any delays or closures.  Dermatology Medication Tips: Please keep the boxes that topical medications come in in order to help keep track of the instructions about where and how to use these. Pharmacies typically print the medication instructions only on the boxes and not directly on the medication tubes.   If your medication is too expensive, please contact our office at [phone] or send [place] a message through MyChart.   We are unable to tell what your co-pay for medications will be in advance as this is different depending on your insurance coverage. However, we may be able to find a substitute medication at lower cost or fill out paperwork to get insurance to cover a needed medication.   If a prior authorization is required to get your medication covered by your insurance company, please allow [place] 1-2 business days to complete this process.  Drug prices often vary depending on where the prescription is filled and some pharmacies may offer cheaper prices.  The website www.goodrx.com contains coupons for medications through different pharmacies. The prices here do not account for what the cost may be with help from insurance (it may be cheaper with your insurance), but the website can give you the price if you did not use any insurance.  - You can print the associated coupon and take it with your prescription to the pharmacy.  -  You may also stop by our office during regular business hours and pick up a GoodRx coupon card.  - If you need your prescription sent electronically to a different pharmacy, notify our office through [facility] or by phone at [phone]    Skin Education :   I counseled the patient regarding the following: Sun screen (SPF 30 or greater) should be applied during peak UV exposure (between 10am and 2pm) and reapplied after exercise or swimming.  The ABCDEs of melanoma were reviewed with the  patient, and the importance of monthly self-examination of moles was emphasized. Should any moles change in shape or color, or itch, bleed or burn, pt will contact our office for evaluation sooner then their interval appointment.  Plan: Sunscreen Recommendations I recommended a broad spectrum sunscreen with a SPF of 30 or higher. I explained that SPF 30 sunscreens block approximately 97 percent of the sun's harmful rays. Sunscreens should be applied at least 15 minutes prior to expected sun exposure and then every 2 hours after that as long as sun exposure continues. If swimming or exercising sunscreen should be reapplied every 45 minutes to an hour after getting wet or sweating. One ounce, or the equivalent of a shot glass full of sunscreen, is adequate to protect the skin not covered by a bathing suit. I also recommended a lip balm with a sunscreen as well. Sun protective clothing can be used in lieu of sunscreen but must be worn the entire time you are exposed to the sun's rays.

## 2023-02-18 NOTE — Progress Notes (Unsigned)
02/19/2023 Abigail Foley [id number] Feb 12, 1961  Referring provider: Myrlene Broker, * Primary GI doctor: Dr. Russella Dar  ASSESSMENT AND PLAN:   History of adenomatous polyp of colon Patient due for recall colon, last one 2019, will schedule with Dr. Russella Dar at [facility] We have discussed the risks of bleeding, infection, perforation, medication reactions, and remote risk of death associated with colonoscopy. All questions were answered and the patient acknowledges these risk and wishes to proceed.  Chronic idiopathic constipation Likely secondary to Ozempic use Can add on MiraLAX daily, instructions given, add fiber Will need to stop 7 days prior to colonoscopy   Patient Care Team: Myrlene Broker, MD as PCP - General (Internal Medicine)  HISTORY OF PRESENT ILLNESS: 62 y.o. female with a past medical history of allergy, anxiety, asthma, and others listed below presents for evaluation of constipation.   12/06/2017 colonoscopy with Dr. Russella Dar with Dr. Russella Dar for increased risk showed 4 polyps 6 to 7 mm in size 2 polyps 4 mm size sigmoid.  Only 1 was precancerous.  Recall 5 years.  Patient is here to discuss repeat colonoscopy but also complains of constipation. She states she has dealt with constipation on and off for years.  Has been started on wellbutrin 3-4 months ago and has been worse with that. She has been on ozempic for 3 months.  Has BM every 2-3 days, can be hard or normal formed stool.  No AB pain, no bloating. Occ gas pain.  She has had some rectal pain with hard Bm's, but no hematochezia.  Patient's had Abnormal diagnostic mammogram, scheduled left and right breast biopsy tomorrow morning.   She denies blood thinner use.  She denies NSAID use.  She denies ETOH use, very rare.  She denies tobacco use.  She denies drug use.    She  reports that she quit smoking about 17 years ago. Her smoking use included cigarettes. She has never used smokeless tobacco. She  reports current alcohol use. She reports that she does not use drugs.  RELEVANT LABS AND IMAGING: CBC    Component Value Date/Time   WBC 6.1 03/01/2022 0927   RBC 3.95 03/01/2022 0927   HGB 12.9 03/01/2022 0927   HCT 37.6 03/01/2022 0927   PLT 257.0 03/01/2022 0927   MCV 95.3 03/01/2022 0927   MCH 31.5 08/29/2016 1000   MCHC 34.3 03/01/2022 0927   RDW 13.2 03/01/2022 0927   LYMPHSABS 2.0 02/01/2014 0857   MONOABS 0.3 02/01/2014 0857   EOSABS 0.2 02/01/2014 0857   BASOSABS 0.1 02/01/2014 0857   Recent Labs    03/01/22 0927  HGB 12.9    CMP     Component Value Date/Time   NA 142 03/01/2022 0927   NA 143 02/11/2020 0827   K 4.2 03/01/2022 0927   CL 106 03/01/2022 0927   CO2 26 03/01/2022 0927   GLUCOSE 98 03/01/2022 0927   BUN 12 03/01/2022 0927   BUN 11 02/11/2020 0827   CREATININE 0.78 03/01/2022 0927   CREATININE 0.76 08/29/2016 1000   CALCIUM 9.6 03/01/2022 0927   PROT 7.2 03/01/2022 0927   PROT 6.9 02/11/2020 0827   ALBUMIN 4.4 03/01/2022 0927   ALBUMIN 4.7 02/11/2020 0827   AST 20 03/01/2022 0927   ALT 22 03/01/2022 0927   ALKPHOS 77 03/01/2022 0927   BILITOT 0.6 03/01/2022 0927   BILITOT 0.5 02/11/2020 0827   GFRNONAA 80 02/11/2020 0827   GFRAA 92 02/11/2020 0827  Latest Ref Rng & Units 03/01/2022    9:27 AM 02/22/2021    8:55 AM 02/11/2020    8:27 AM  Hepatic Function  Total Protein 6.0 - 8.3 g/dL 7.2  7.1  6.9   Albumin 3.5 - 5.2 g/dL 4.4  4.4  4.7   AST 0 - 37 U/L 20  15  20    ALT 0 - 35 U/L 22  15  22    Alk Phosphatase 39 - 117 U/L 77  81  104   Total Bilirubin 0.2 - 1.2 mg/dL 0.6  0.5  0.5       Current Medications:    Current Outpatient Medications (Cardiovascular):    atorvastatin (LIPITOR) 10 MG tablet, Take 1 tablet (10 mg total) by mouth daily.  Current Outpatient Medications (Respiratory):    loratadine (CLARITIN) 10 MG tablet, Take 10 mg by mouth daily.    Current Outpatient Medications (Other):    buPROPion (WELLBUTRIN  XL) 150 MG 24 hr tablet, Take 1 tablet (150 mg total) by mouth daily.   chlorhexidine (HIBICLENS) 4 % external liquid, Apply topically once a week.   Cholecalciferol (VITAMIN D3) 2000 UNITS capsule, Take 5,000 Units by mouth daily.    doxycycline (VIBRAMYCIN) 100 MG capsule, Take 1 capsule (100 mg total) by mouth 2 (two) times daily for 5 days with food as needed.   Semaglutide-Weight Management 1 MG/0.5ML SOAJ, Inject 1 mg into the skin once a week for 28 days.   Semaglutide-Weight Management 1.7 MG/0.75ML SOAJ, Inject 1.7 mg into the skin once a week for 28 days.   [START ON 03/16/2023] Semaglutide-Weight Management 2.4 MG/0.75ML SOAJ, Inject 2.4 mg into the skin once a week for 28 days.   UNABLE TO FIND, Take 1 tablet by mouth daily. Med Name: Merdis Delay   valACYclovir (VALTREX) 500 MG tablet, Take 1 tablet (500 mg total) by mouth 2 (two) times daily.   Zinc 50 MG TABS, Take by mouth daily.  Medical History:  Past Medical History:  Diagnosis Date   Actinic keratosis    Allergy    Anemia    Anxiety    Asthma    Blood transfusion without reported diagnosis    Hot flashes    Allergies:  Allergies  Allergen Reactions   Cefaclor     REACTION: anaphalaxis   Sulfa Antibiotics Hives     Surgical History:  She  has a past surgical history that includes Tubal ligation; Cesarean section; Dilation and curettage of uterus; Wisdom tooth extraction; Ectopic pregnancy surgery; and Ablation. Family History:  Her family history includes Breast cancer in her sister; Cancer in her brother; Colon cancer in her maternal aunt; Diabetes in her father; Esophageal cancer in her brother; Hyperlipidemia in her brother, father, and sister; Hypertension in her brother and mother; Liver cancer in her maternal uncle.  REVIEW OF SYSTEMS  : All other systems reviewed and negative except where noted in the History of Present Illness.  PHYSICAL EXAM: BP 110/60   Pulse 75   Ht 5\' 5"  (1.651 m)   Wt 151 lb 3.2  oz (68.6 kg)   BMI 25.16 kg/m  General Appearance: Well nourished, in no apparent distress. Head:   Normocephalic and atraumatic. Eyes:  sclerae anicteric,conjunctive pink  Respiratory: Respiratory effort normal, BS equal bilaterally without rales, rhonchi, wheezing. Cardio: RRR with no MRGs. Peripheral pulses intact.  Abdomen: Soft,  Non-distended ,active bowel sounds. No tenderness . Without guarding and Without rebound. No masses. Rectal: Not evaluated Musculoskeletal:  Full ROM, Normal gait. Without edema. Skin:  Dry and intact without significant lesions or rashes Neuro: Alert and  oriented x4;  No focal deficits. Psych:  Cooperative. Normal mood and affect.    Doree Albee, PA-C 12:23 PM

## 2023-02-19 ENCOUNTER — Other Ambulatory Visit (HOSPITAL_COMMUNITY): Payer: Self-pay

## 2023-02-19 ENCOUNTER — Encounter: Payer: Self-pay | Admitting: Physician Assistant

## 2023-02-19 ENCOUNTER — Ambulatory Visit (INDEPENDENT_AMBULATORY_CARE_PROVIDER_SITE_OTHER): Payer: 59 | Admitting: Physician Assistant

## 2023-02-19 VITALS — BP 110/60 | HR 75 | Ht 65.0 in | Wt 151.2 lb

## 2023-02-19 DIAGNOSIS — Z86010 Personal history of colonic polyps: Secondary | ICD-10-CM | POA: Diagnosis not present

## 2023-02-19 DIAGNOSIS — K5904 Chronic idiopathic constipation: Secondary | ICD-10-CM

## 2023-02-19 NOTE — Patient Instructions (Addendum)
Miralax is an osmotic laxative.  It only brings more water into the stool.  This is safe to take daily.  Can take up to 17 gram of miralax twice a day.  Mix with juice or coffee.  Start 1 capful at night for 3-4 days and reassess your response in 3-4 days.  You can increase and decrease the dose based on your response.  Remember, it can take up to 3-4 days to take effect OR for the effects to wear off.   I often pair this with benefiber in the morning to help assure the stool is not too loose.   You have been scheduled for a colonoscopy. Please follow written instructions given to you at your visit today.  Please pick up your prep supplies at the pharmacy within the next 1-3 days. If you use inhalers (even only as needed), please bring them with you on the day of your procedure.  _______________________________________________________  If your blood pressure at your visit was 140/90 or greater, please contact your primary care physician to follow up on this.  _______________________________________________________  If you are age 51 or older, your body mass index should be between 23-30. Your Body mass index is 25.16 kg/m. If this is out of the aforementioned range listed, please consider follow up with your Primary Care Provider.  If you are age 57 or younger, your body mass index should be between 19-25. Your Body mass index is 25.16 kg/m. If this is out of the aformentioned range listed, please consider follow up with your Primary Care Provider.   ________________________________________________________  The [place] [facility] providers would like to encourage you to use [facility] to communicate with providers for non-urgent requests or questions.  Due to long hold times on the telephone, sending your provider a message by [facility] may be a faster and more efficient way to get a response.  Please allow 48 business hours for a response.  Please remember that this is for non-urgent requests.   _______________________________________________________ It was a pleasure to see you today!  Thank you for trusting me with your gastrointestinal care!

## 2023-02-20 ENCOUNTER — Ambulatory Visit
Admission: RE | Admit: 2023-02-20 | Discharge: 2023-02-20 | Disposition: A | Discharge status: Home / Self Care | Payer: 59 | Source: Ambulatory Visit | Attending: Internal Medicine | Admitting: Internal Medicine

## 2023-02-20 DIAGNOSIS — D0591 Unspecified type of carcinoma in situ of right breast: Secondary | ICD-10-CM | POA: Diagnosis not present

## 2023-02-20 DIAGNOSIS — N63 Unspecified lump in unspecified breast: Secondary | ICD-10-CM | POA: Diagnosis not present

## 2023-02-20 DIAGNOSIS — R921 Mammographic calcification found on diagnostic imaging of breast: Secondary | ICD-10-CM

## 2023-02-20 DIAGNOSIS — N6314 Unspecified lump in the right breast, lower inner quadrant: Secondary | ICD-10-CM

## 2023-02-20 HISTORY — PX: BREAST BIOPSY: SHX20

## 2023-02-21 ENCOUNTER — Telehealth: Payer: Self-pay | Admitting: Hematology and Oncology

## 2023-02-21 NOTE — Telephone Encounter (Signed)
Spoke to patient to confirm upcoming morning [facility] clinic appointment on 6/5 paperwork will be sent via mail.   Gave location and time, also informed patient that the surgeon's office would be calling as well to get information from them similar to the packet that they will be receiving so make sure to do both.  Reminded patient that all providers will be coming to the clinic to see them HERE and if they had any questions to not hesitate to reach back out to myself or their navigators.

## 2023-03-04 ENCOUNTER — Other Ambulatory Visit: Payer: Self-pay

## 2023-03-04 ENCOUNTER — Other Ambulatory Visit (HOSPITAL_COMMUNITY): Payer: Self-pay

## 2023-03-04 MED ORDER — NA SULFATE-K SULFATE-MG SULF 17.5-3.13-1.6 GM/177ML PO SOLN
1.0000 | Freq: Once | ORAL | 0 refills | Status: AC
  Filled 2023-03-04: qty 354, 7d supply, fill #0

## 2023-03-05 ENCOUNTER — Encounter: Payer: Self-pay | Admitting: *Deleted

## 2023-03-05 DIAGNOSIS — C50511 Malignant neoplasm of lower-outer quadrant of right female breast: Secondary | ICD-10-CM | POA: Insufficient documentation

## 2023-03-06 ENCOUNTER — Inpatient Hospital Stay: Payer: 59 | Attending: Hematology and Oncology | Admitting: Hematology and Oncology

## 2023-03-06 ENCOUNTER — Inpatient Hospital Stay: Payer: 59 | Admitting: Licensed Clinical Social Worker

## 2023-03-06 ENCOUNTER — Ambulatory Visit: Payer: 59 | Attending: Surgery | Admitting: Physical Therapy

## 2023-03-06 ENCOUNTER — Inpatient Hospital Stay (HOSPITAL_BASED_OUTPATIENT_CLINIC_OR_DEPARTMENT_OTHER): Payer: 59

## 2023-03-06 ENCOUNTER — Encounter: Payer: Self-pay | Admitting: Hematology and Oncology

## 2023-03-06 ENCOUNTER — Other Ambulatory Visit (HOSPITAL_COMMUNITY): Payer: Self-pay

## 2023-03-06 ENCOUNTER — Other Ambulatory Visit: Payer: Self-pay | Admitting: Surgery

## 2023-03-06 ENCOUNTER — Inpatient Hospital Stay (HOSPITAL_BASED_OUTPATIENT_CLINIC_OR_DEPARTMENT_OTHER): Payer: 59 | Admitting: Genetic Counselor

## 2023-03-06 ENCOUNTER — Encounter: Payer: Self-pay | Admitting: Physical Therapy

## 2023-03-06 ENCOUNTER — Encounter: Payer: Self-pay | Admitting: *Deleted

## 2023-03-06 ENCOUNTER — Encounter: Payer: Self-pay | Admitting: Radiology

## 2023-03-06 ENCOUNTER — Other Ambulatory Visit: Payer: Self-pay

## 2023-03-06 ENCOUNTER — Ambulatory Visit
Admission: RE | Admit: 2023-03-06 | Discharge: 2023-03-06 | Disposition: A | Discharge status: Home / Self Care | Payer: 59 | Source: Ambulatory Visit | Attending: Radiation Oncology | Admitting: Radiation Oncology

## 2023-03-06 VITALS — BP 90/60 | HR 80 | Temp 97.9°F | Resp 16 | Ht 65.0 in | Wt 149.2 lb

## 2023-03-06 DIAGNOSIS — C50511 Malignant neoplasm of lower-outer quadrant of right female breast: Secondary | ICD-10-CM

## 2023-03-06 DIAGNOSIS — Z170 Estrogen receptor positive status [ER+]: Secondary | ICD-10-CM | POA: Insufficient documentation

## 2023-03-06 DIAGNOSIS — Z803 Family history of malignant neoplasm of breast: Secondary | ICD-10-CM | POA: Diagnosis not present

## 2023-03-06 DIAGNOSIS — Z87891 Personal history of nicotine dependence: Secondary | ICD-10-CM | POA: Diagnosis not present

## 2023-03-06 DIAGNOSIS — Z800 Family history of malignant neoplasm of digestive organs: Secondary | ICD-10-CM | POA: Diagnosis not present

## 2023-03-06 DIAGNOSIS — R293 Abnormal posture: Secondary | ICD-10-CM | POA: Insufficient documentation

## 2023-03-06 DIAGNOSIS — Z853 Personal history of malignant neoplasm of breast: Secondary | ICD-10-CM | POA: Diagnosis not present

## 2023-03-06 DIAGNOSIS — C50911 Malignant neoplasm of unspecified site of right female breast: Secondary | ICD-10-CM | POA: Diagnosis not present

## 2023-03-06 LAB — CMP (CANCER CENTER ONLY)
ALT: 13 U/L (ref 0–44)
AST: 15 U/L (ref 15–41)
Albumin: 4.3 g/dL (ref 3.5–5.0)
Alkaline Phosphatase: 81 U/L (ref 38–126)
Anion gap: 7 (ref 5–15)
BUN: 8 mg/dL (ref 8–23)
CO2: 26 mmol/L (ref 22–32)
Calcium: 9.1 mg/dL (ref 8.9–10.3)
Chloride: 106 mmol/L (ref 98–111)
Creatinine: 0.77 mg/dL (ref 0.44–1.00)
GFR, Estimated: >60 mL/min (ref >60)
Glucose, Bld: 96 mg/dL (ref 70–99)
Potassium: 4 mmol/L (ref 3.5–5.1)
Sodium: 139 mmol/L (ref 135–145)
Total Bilirubin: 0.7 mg/dL (ref 0.3–1.2)
Total Protein: 6.9 g/dL (ref 6.5–8.1)

## 2023-03-06 LAB — CBC WITH DIFFERENTIAL (CANCER CENTER ONLY)
Abs Immature Granulocytes: 0.02 10*3/uL (ref 0.00–0.07)
Basophils Absolute: 0 10*3/uL (ref 0.0–0.1)
Basophils Relative: 1 %
Eosinophils Absolute: 0.1 10*3/uL (ref 0.0–0.5)
Eosinophils Relative: 2 %
HCT: 36 % (ref 36.0–46.0)
Hemoglobin: 12 g/dL (ref 12.0–15.0)
Immature Granulocytes: 0 %
Lymphocytes Relative: 27 %
Lymphs Abs: 1.5 10*3/uL (ref 0.7–4.0)
MCH: 31.7 pg (ref 26.0–34.0)
MCHC: 33.3 g/dL (ref 30.0–36.0)
MCV: 95.2 fL (ref 80.0–100.0)
Monocytes Absolute: 0.4 10*3/uL (ref 0.1–1.0)
Monocytes Relative: 7 %
Neutro Abs: 3.4 10*3/uL (ref 1.7–7.7)
Neutrophils Relative %: 63 %
Platelet Count: 264 10*3/uL (ref 150–400)
RBC: 3.78 MIL/uL — ABNORMAL LOW (ref 3.87–5.11)
RDW: 13 % (ref 11.5–15.5)
WBC Count: 5.4 10*3/uL (ref 4.0–10.5)
nRBC: 0 % (ref 0.0–0.2)

## 2023-03-06 LAB — GENETIC SCREENING ORDER

## 2023-03-06 NOTE — Therapy (Signed)
OUTPATIENT PHYSICAL THERAPY BREAST CANCER BASELINE EVALUATION   Patient Name: Abigail Foley MRN: [id number] DOB:Jun 14, 1961, 62 y.o., female Today's Date: 03/06/2023  END OF SESSION:  PT End of Session - 03/06/23 1211     Visit Number 1    Number of Visits 2    Date for PT Re-Evaluation 05/01/23    PT Start Time 0910    PT Stop Time 0937   Also saw pt from 1130-1138 for a total of 35 min   PT Time Calculation (min) 27 min    Activity Tolerance Patient tolerated treatment well    Behavior During Therapy [facility] for tasks assessed/performed             Past Medical History:  Diagnosis Date   Actinic keratosis    Allergy    Anemia    Anxiety    Asthma    Blood transfusion without reported diagnosis    Hot flashes    Past Surgical History:  Procedure Laterality Date   ABLATION     BREAST BIOPSY Left 02/20/2023   MM LT BREAST BX W LOC DEV 1ST LESION IMAGE BX SPEC STEREO GUIDE 02/20/2023 [facility]   BREAST BIOPSY Right 02/20/2023   [place] RT BREAST BX W LOC DEV 1ST LESION IMG BX SPEC US GUIDE 02/20/2023 [facility]   CESAREAN SECTION     DILATION AND CURETTAGE OF UTERUS     ECTOPIC PREGNANCY SURGERY     TUBAL LIGATION     WISDOM TOOTH EXTRACTION     Patient Active Problem List   Diagnosis Date Noted   Malignant neoplasm of lower-outer quadrant of right breast of female, estrogen receptor positive (HCC) 03/05/2023   Mass of lower inner quadrant of right breast 01/16/2023   Overweight 11/20/2022   Hyperlipidemia 03/01/2022   Routine general medical examination at a health care facility 02/24/2021   Cold sore 02/24/2021   Glaucoma 01/06/2016   Varicose veins of lower extremities with other complications 08/06/2013   Allergic rhinitis 08/31/2008    REFERRING PROVIDER: Dr. Abigail Miyamoto  REFERRING DIAG: Right breast cancer  THERAPY DIAG:  Malignant neoplasm of lower-outer quadrant of right breast of female, estrogen receptor positive (HCC)  Abnormal  posture  Rationale for Evaluation and Treatment: Rehabilitation  ONSET DATE: 02/21/2023  SUBJECTIVE:                                                                                                                                                                                           SUBJECTIVE STATEMENT: Patient reports she is here today to be seen by her medical team for her newly diagnosed  right breast cancer.   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 It measures 1.4 cm and is located in the lower outer quadrant. It is ER/PR positive and HER2 negative with a Ki67 of 3%.   PATIENT GOALS:   reduce lymphedema risk and learn post op HEP.   PAIN:  Are you having pain? Yes: NPRS scale: varies/10 Pain location: right superior pectoralis and shoulder region Pain description: pinching Aggravating factors: reaching across her body with the right arm Relieving factors: leaving arm at rest  PRECAUTIONS: Active CA   HAND DOMINANCE: right  WEIGHT BEARING RESTRICTIONS: No  FALLS:  Has patient fallen in last 6 months? No  LIVING ENVIRONMENT: Patient lives with: her husband and 65 y.o. 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  LEISURE: She walks daily for 30 min and lifts upper body weights  PRIOR LEVEL OF FUNCTION: Independent   OBJECTIVE:  COGNITION: Overall cognitive status: Within functional limits for tasks assessed    POSTURE:  Forward head and rounded shoulders posture  UPPER EXTREMITY AROM/PROM:  A/PROM RIGHT   eval   Shoulder extension 46  Shoulder flexion 147  Shoulder abduction 162  Shoulder internal rotation 65  Shoulder external rotation 88    (Blank rows = not tested)  A/PROM LEFT   eval  Shoulder extension 50  Shoulder flexion 147  Shoulder abduction 158  Shoulder internal  rotation 67  Shoulder external rotation 88    (Blank rows = not tested)  CERVICAL AROM: All within normal limits:    Percent limited  Flexion WNL  Extension WNL  Right lateral flexion 25% limited  Left lateral flexion 25% limited  Right rotation WNL  Left rotation WNL    UPPER EXTREMITY STRENGTH: WNL  LYMPHEDEMA ASSESSMENTS:   LANDMARK RIGHT   eval  10 cm proximal to olecranon process 25.9  Olecranon process 23.4  10 cm proximal to ulnar styloid process 20.8  Just proximal to ulnar styloid process 14.9  Across hand at thumb web space 18.5  At base of 2nd digit 6.1  (Blank rows = not tested)  LANDMARK LEFT   eval  10 cm proximal to olecranon process 26.7  Olecranon process 22.9  10 cm proximal to ulnar styloid process 20.3  Just proximal to ulnar styloid process 14.9  Across hand at thumb web space 18.1  At base of 2nd digit 5.5  (Blank rows = not tested)  L-DEX LYMPHEDEMA SCREENING:  The patient was assessed using the L-Dex machine today to produce a lymphedema index baseline score. The patient will be reassessed on a regular basis (typically every 3 months) to obtain new L-Dex scores. If the score is > 6.5 points away from his/her baseline score indicating onset of subclinical lymphedema, it will be recommended to wear a compression garment for 4 weeks, 12 hours per day and then be reassessed. If the score continues to be > 6.5 points from baseline at reassessment, we will initiate lymphedema treatment. Assessing in this manner has a 95% rate of preventing clinically significant lymphedema.   L-DEX FLOWSHEETS - 03/06/23 1200       L-DEX LYMPHEDEMA SCREENING   Measurement Type Unilateral    L-DEX MEASUREMENT EXTREMITY Upper Extremity    POSITION  Standing    DOMINANT SIDE Right    At Risk Side Right    BASELINE SCORE (UNILATERAL) 0.7  QUICK DASH SURVEY:  Neldon Mc - 03/06/23 0001     Open a tight or new jar Moderate difficulty    Do heavy  household chores (wash walls, wash floors) No difficulty    Carry a shopping bag or briefcase No difficulty    Wash your back Mild difficulty    Use a knife to cut food No difficulty    Recreational activities in which you take some force or impact through your arm, shoulder, or hand (golf, hammering, tennis) Mild difficulty    During the past week, to what extent has your arm, shoulder or hand problem interfered with your normal social activities with family, friends, neighbors, or groups? Not at all    During the past week, to what extent has your arm, shoulder or hand problem limited your work or other regular daily activities Not at all    Arm, shoulder, or hand pain. Mild    Tingling (pins and needles) in your arm, shoulder, or hand None    Difficulty Sleeping No difficulty    DASH Score 11.36 %              PATIENT EDUCATION:  Education details: Lymphedema risk reduction and post op shoulder/posture HEP Person educated: Patient Education method: Explanation, Demonstration, Handout Education comprehension: Patient verbalized understanding and returned demonstration  HOME EXERCISE PROGRAM: Patient was instructed today in a home exercise program today for post op shoulder range of motion. These included active assist shoulder flexion in sitting, scapular retraction, wall walking with shoulder abduction, and hands behind head external rotation.  She was encouraged to do these twice a day, holding 3 seconds and repeating 5 times when permitted by her physician.   ASSESSMENT:  CLINICAL IMPRESSION: Patient was diagnosed on 02/21/2023 with right grade 2 invasive lobular carcinoma breast cancer. It measures 1.4 cm and is located in the lower outer quadrant. It is ER/PR positive and HER2 negative with a Ki67 of 3%. Her multidisciplinary medical team met prior to her assessments to determine a recommended treatment plan. She is planning to have a right lumpectomy and sentinel node biopsy  followed by Oncotype testing, radiation, and anti-estrogen therapy. She will benefit from a post op PT reassessment to determine needs and from L-Dex screens every 3 months for 2 years to detect subclinical lymphedema.  Pt will benefit from skilled therapeutic intervention to improve on the following deficits: Decreased knowledge of precautions, impaired UE functional use, pain, decreased ROM, postural dysfunction.   PT treatment/interventions: ADL/self-care home management, pt/family education, therapeutic exercise  REHAB POTENTIAL: Excellent  CLINICAL DECISION MAKING: Stable/uncomplicated  EVALUATION COMPLEXITY: Low   GOALS: Goals reviewed with patient? YES  LONG TERM GOALS: (STG=LTG)    Name Target Date Goal status  1 Pt will be able to verbalize understanding of pertinent lymphedema risk reduction practices relevant to her dx specifically related to skin care.  Baseline:  No knowledge 03/06/2023 Achieved at eval  2 Pt will be able to return demo and/or verbalize understanding of the post op HEP related to regaining shoulder ROM. Baseline:  No knowledge 03/06/2023 Achieved at eval  3 Pt will be able to verbalize understanding of the importance of attending the post op After Breast CA Class for further lymphedema risk reduction education and therapeutic exercise.  Baseline:  No knowledge 03/06/2023 Achieved at eval  4 Pt will demo she has regained full shoulder ROM and function post operatively compared to baselines.  Baseline: See objective measurements taken today. 05/01/2023  PLAN:  PT FREQUENCY/DURATION: EVAL and 1 follow up appointment.   PLAN FOR NEXT SESSION: will reassess 3-4 weeks post op to determine needs.   Patient will follow up at outpatient cancer rehab 3-4 weeks following surgery.  If the patient requires physical therapy at that time, a specific plan will be dictated and sent to the referring physician for approval. The patient was educated today on appropriate  basic range of motion exercises to begin post operatively and the importance of attending the After Breast Cancer class following surgery.  Patient was educated today on lymphedema risk reduction practices as it pertains to recommendations that will benefit the patient immediately following surgery.  She verbalized good understanding.    Physical Therapy Information for After Breast Cancer Surgery/Treatment:  Lymphedema is a swelling condition that you may be at risk for in your arm if you have lymph nodes removed from the armpit area.  After a sentinel node biopsy, the risk is approximately 5-9% and is higher after an axillary node dissection.  There is treatment available for this condition and it is not life-threatening.  Contact your physician or physical therapist with concerns. You may begin the 4 shoulder/posture exercises (see additional sheet) when permitted by your physician (typically a week after surgery).  If you have drains, you may need to wait until those are removed before beginning range of motion exercises.  A general recommendation is to not lift your arms above shoulder height until drains are removed.  These exercises should be done to your tolerance and gently.  This is not a "no pain/no gain" type of recovery so listen to your body and stretch into the range of motion that you can tolerate, stopping if you have pain.  If you are having immediate reconstruction, ask your plastic surgeon about doing exercises as he or she may want you to wait. We encourage you to attend the free one time ABC (After Breast Cancer) class offered by [facility].  You will learn information related to lymphedema risk, prevention and treatment and additional exercises to regain mobility following surgery.  You can call [phone] for more information.  This is offered the 1st and 3rd Monday of each month.  You only attend the class one time. While undergoing any medical procedure or  treatment, try to avoid blood pressure being taken or needle sticks from occurring on the arm on the side of cancer.   This recommendation begins after surgery and continues for the rest of your life.  This may help reduce your risk of getting lymphedema (swelling in your arm). An excellent resource for those seeking information on lymphedema is the National Lymphedema Network's web site. It can be accessed at www.lymphnet.org If you notice swelling in your hand, arm or breast at any time following surgery (even if it is many years from now), please contact your doctor or physical therapist to discuss this.  Lymphedema can be treated at any time but it is easier for you if it is treated early on.  If you feel like your shoulder motion is not returning to normal in a reasonable amount of time, please contact your surgeon or physical therapist.  [facility] [phone]. [address], [place] [address]  ABC CLASS After Breast Cancer Class  After Breast Cancer Class is a specially designed exercise class to assist you in a safe recover after having breast cancer surgery.  In this class you will  learn how to get back to full function whether your drains were just removed or if you had surgery a month ago.  This one-time class is held the 1st and 3rd Monday of every month from 11:00 a.m. until 12:00 noon virtually.  This class is FREE and space is limited. For more information or to register for the next available class, call [phone].  Class Goals  Understand specific stretches to improve the flexibility of you chest and shoulder. Learn ways to safely strengthen your upper body and improve your posture. Understand the warning signs of infection and why you may be at risk for an arm infection. Learn about Lymphedema and prevention.  ** You do not attend this class until after surgery.  Drains must be removed to participate  Patient was  instructed today in a home exercise program today for post op shoulder range of motion. These included active assist shoulder flexion in sitting, scapular retraction, wall walking with shoulder abduction, and hands behind head external rotation.  She was encouraged to do these twice a day, holding 3 seconds and repeating 5 times when permitted by her physician.  Bethann Punches, [place] 03/06/23 12:26 PM

## 2023-03-06 NOTE — Progress Notes (Signed)
[facility] Clinical Social Work  Initial Assessment   Abigail Foley is a 62 y.o. year old female accompanied by husband, Minerva Areola. Clinical Social Work was referred by  [facility]  for assessment of psychosocial needs.   SDOH (Social Determinants of Health) assessments performed: Yes SDOH Interventions    Flowsheet Row Clinical Support from 03/06/2023 in [facility] at [facility]  SDOH Interventions   Food Insecurity Interventions Intervention Not Indicated  Housing Interventions Intervention Not Indicated  Transportation Interventions Intervention Not Indicated  Utilities Interventions Intervention Not Indicated       SDOH Screenings   Food Insecurity: No Food Insecurity (03/06/2023)  Housing: Low Risk  (03/06/2023)  Transportation Needs: No Transportation Needs (03/06/2023)  Utilities: Not At Risk (03/06/2023)  Depression (PHQ2-9): Low Risk  (01/16/2023)  Tobacco Use: Medium Risk (03/06/2023)     Distress Screen completed: No     No data to display            Family/Social Information:  Housing Arrangement: patient lives with husband, Minerva Areola . Mother in law has been living with them for about 3 years- pt & husband are her caregivers. 2 adult children Karleen Hampshire 8 in [place], Melanee Spry 29 in [place]) Family members/support persons in your life? 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  Income source: Employment Financial concerns: No Type of concern: None Food access concerns: no Religious or spiritual practice: Not known Services Currently in place:  [employer]  Coping/ Adjustment to diagnosis: Patient understands treatment plan and what happens next? yes Patient reported stressors: Adjusting to my illness Patient enjoys reading and walking. Likes painting but has not been doing as much lately Current coping skills/ strengths: Ability for insight , Capable of independent living  , Communication skills , Motivation for treatment/growth , Special hobby/interest , and Supportive family/friends     SUMMARY: Current SDOH Barriers:  No major barriers noted today  Clinical Social Work Clinical Goal(s):  No clinical social work goals at this time  Interventions: Discussed common feeling and emotions when being diagnosed with cancer, and the importance of support during treatment Informed patient of the support team roles and support services at [facility] Provided CSW contact information and encouraged patient to call with any questions or concerns   Follow Up Plan: Patient will contact CSW with any support or resource needs Patient verbalizes understanding of plan: Yes    Wm Fruchter E Virgene Tirone, LCSW Clinical Social Worker [facility]

## 2023-03-06 NOTE — Progress Notes (Signed)
Radiation Oncology         [phone] ________________________________  Name: Abigail Foley        MRN: [id number]  Date of Service: 03/06/2023 DOB: [birth year]  [id number], Austin Miles, MD  Abigail Miyamoto, MD     REFERRING PHYSICIAN: Abigail Miyamoto, MD   DIAGNOSIS: The encounter diagnosis was Malignant neoplasm of lower-outer quadrant of right breast of female, estrogen receptor positive (HCC).   HISTORY OF PRESENT ILLNESS: Abigail Foley is a 62 y.o. female seen in the multidisciplinary breast clinic for a new diagnosis of right breast cancer. The patient was noted to have a palpable mass in the right breast and left calcifications during diagnostic work up. Her calcifications in the left breast were followed, biopsied, and benign concordant to imaging. Her right breast showed a mass in the 6:00 position of the breast measuring 1.4 cm, and her axilla was negative for adenopathy. A biopsy on 02/20/23 showed a grade 2 invasive lobular carcinoma that was ER/PR positive, HER2 negative with a Ki 67 of 3%. She's seen today to discuss treatment recommendations of her cancer.    PREVIOUS RADIATION THERAPY: No   PAST MEDICAL HISTORY:  Past Medical History:  Diagnosis Date   Actinic keratosis    Allergy    Anemia    Anxiety    Asthma    Blood transfusion without reported diagnosis    Hot flashes        PAST SURGICAL HISTORY: Past Surgical History:  Procedure Laterality Date   ABLATION     BREAST BIOPSY Left 02/20/2023   MM LT BREAST BX W LOC DEV 1ST LESION IMAGE BX SPEC STEREO GUIDE 02/20/2023 [facility]   BREAST BIOPSY Right 02/20/2023   [place] RT BREAST BX W LOC DEV 1ST LESION IMG BX SPEC US GUIDE 02/20/2023 [facility]   CESAREAN SECTION     DILATION AND CURETTAGE OF UTERUS     ECTOPIC PREGNANCY SURGERY     TUBAL LIGATION     WISDOM TOOTH EXTRACTION       FAMILY HISTORY:  Family History  Problem Relation Age of Onset   Hypertension Mother     Diabetes Father    Hyperlipidemia Father    Hyperlipidemia Sister    Breast cancer Sister    Cancer Brother    Hyperlipidemia Brother    Hypertension Brother    Esophageal cancer Brother    Colon cancer Maternal Aunt    Liver cancer Maternal Uncle    Pancreatic cancer Neg Hx    Rectal cancer Neg Hx    Stomach cancer Neg Hx      SOCIAL HISTORY:  reports that she quit smoking about 17 years ago. Her smoking use included cigarettes. She has never used smokeless tobacco. She reports current alcohol use. She reports that she does not use drugs. The patient is married and lives in [place]. 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 She's accompanied by her husband.    ALLERGIES: Cefaclor and Sulfa antibiotics   MEDICATIONS:  Current Outpatient Medications  Medication Sig Dispense Refill   atorvastatin (LIPITOR) 10 MG tablet Take 1 tablet (10 mg total) by mouth daily. 90 tablet 3   buPROPion (WELLBUTRIN XL) 150 MG 24 hr tablet Take 1 tablet (150 mg total) by mouth daily. 90 tablet 1   chlorhexidine (HIBICLENS) 4 % external liquid Apply  topically once a week. 120 mL 0   Cholecalciferol (VITAMIN D3) 2000 UNITS capsule Take 5,000 Units by mouth daily.      loratadine (CLARITIN) 10 MG tablet Take 10 mg by mouth daily.     Semaglutide-Weight Management 1.7 MG/0.75ML SOAJ Inject 1.7 mg into the skin once a week for 28 days. 3 mL 0   [START ON 03/16/2023] Semaglutide-Weight Management 2.4 MG/0.75ML SOAJ Inject 2.4 mg into the skin once a week for 28 days. 3 mL 0   UNABLE TO FIND Take 1 tablet by mouth daily. Med Name: Merdis Delay     valACYclovir (VALTREX) 500 MG tablet Take 1 tablet (500 mg total) by mouth 2 (two) times daily. 90 tablet 1   Zinc 50 MG TABS Take by mouth daily.     No current facility-administered medications for this visit.     REVIEW OF  SYSTEMS: On review of systems, the patient reports that she is doing well. She's had some joint aches in the hips, knees, and shoulder. She does have soreness since her breast biopsy. No other complaints are verbalized.      PHYSICAL EXAM:  Wt Readings from Last 3 Encounters:  02/19/23 151 lb 3.2 oz (68.6 kg)  01/16/23 158 lb (71.7 kg)  11/20/22 173 lb (78.5 kg)   Temp Readings from Last 3 Encounters:  01/16/23 98.4 F (36.9 C) (Oral)  11/20/22 98.3 F (36.8 C) (Oral)  02/22/21 98.3 F (36.8 C) (Oral)   BP Readings from Last 3 Encounters:  02/19/23 110/60  02/14/23 95/62  01/16/23 100/80   Pulse Readings from Last 3 Encounters:  02/19/23 75  01/16/23 62  11/20/22 72    In general this is a well appearing caucasian female in no acute distress. She's alert and oriented x4 and appropriate throughout the examination. Cardiopulmonary assessment is negative for acute distress and she exhibits normal effort. Bilateral breast exam is deferred.    ECOG = 1  0 - Asymptomatic (Fully active, able to carry on all predisease activities without restriction)  1 - Symptomatic but completely ambulatory (Restricted in physically strenuous activity but ambulatory and able to carry out work of a light or sedentary nature. For example, light housework, office work)  2 - Symptomatic, <50% in bed during the day (Ambulatory and capable of all self care but unable to carry out any work activities. Up and about more than 50% of waking hours)  3 - Symptomatic, >50% in bed, but not bedbound (Capable of only limited self-care, confined to bed or chair 50% or more of waking hours)  4 - Bedbound (Completely disabled. Cannot carry on any self-care. Totally confined to bed or chair)  5 - Death   Santiago Glad MM, Creech RH, Tormey DC, et al. [id number]). "Toxicity and response criteria of the [facility]". Am. Evlyn Clines. Oncol. 5 (6): 649-55    LABORATORY DATA:  Lab Results  Component Value  Date   WBC 6.1 03/01/2022   HGB 12.9 03/01/2022   HCT 37.6 03/01/2022   MCV 95.3 03/01/2022   PLT 257.0 03/01/2022   Lab Results  Component Value Date   NA 142 03/01/2022   K 4.2 03/01/2022   CL 106 03/01/2022   CO2 26 03/01/2022   Lab Results  Component Value Date   ALT 22 03/01/2022   AST 20 03/01/2022   ALKPHOS 77 03/01/2022   BILITOT 0.6 03/01/2022      RADIOGRAPHY: [place] RT BREAST BX W LOC DEV 1ST  LESION IMG BX SPEC US GUIDE  Addendum Date: 02/21/2023   ADDENDUM REPORT: 02/21/2023 16:21 ADDENDUM: PATHOLOGY revealed: Site 1. Breast, RIGHT, needle core biopsy, 6 o'clock, 4 cm fn, ribbon clip - INVASIVE MAMMARY CARCINOMA, SEE NOTE TUBULE FORMATION: SCORE 3 - NUCLEAR PLEOMORPHISM: SCORE 2 MITOTIC COUNT: SCORE 1 - TOTAL SCORE: 6 - OVERALL GRADE: 2 - LYMPHOVASCULAR INVASION: NOT IDENTIFIED - CANCER LENGTH: 1.5 CM - CALCIFICATIONS: NOT IDENTIFIED - OTHER FINDINGS: NONE Pathology results are CONCORDANT with imaging findings, per Dr. Emmaline Kluver. PATHOLOGY revealed: Site 2. Breast, LEFT, needle core biopsy, upper outer, x clip - FIBROCYSTIC CHANGE WITH CALCIFICATIONS - NEGATIVE FOR CARCINOMA Pathology results are CONCORDANT with imaging findings, per Dr. Emmaline Kluver Pathology results and recommendations below were discussed with patient by telephone on 02/21/2023. Patient reported biopsy site within normal limits with slight tenderness at the site. Post biopsy care instructions were reviewed, questions were answered and my direct phone number was provided to patient. Patient was instructed to call [facility] if any concerns or questions arise related to the biopsy. The patient was referred to the [facility] at [facility] with appointment on 03/06/2023. Pathology results reported by Lynett Grimes, RN on 02/21/2023. Electronically Signed   By: Emmaline Kluver M.D.   On: 02/21/2023 16:21   Result Date:  02/21/2023 CLINICAL DATA:  62 year old female presenting for biopsy of a mass in the right breast. EXAM: ULTRASOUND GUIDED RIGHT BREAST CORE NEEDLE BIOPSY COMPARISON:  Previous exam(s). PROCEDURE: I met with the patient and we discussed the procedure of ultrasound-guided biopsy, including benefits and alternatives. We discussed the high likelihood of a successful procedure. We discussed the risks of the procedure, including infection, bleeding, tissue injury, clip migration, and inadequate sampling. Informed written consent was given. The usual time-out protocol was performed immediately prior to the procedure. Lesion quadrant: Lower inner quadrant Using sterile technique and 1% Lidocaine as local anesthetic, under direct ultrasound visualization, a 14 gauge spring-loaded device was used to perform biopsy of a mass in the right breast at 6 o'clock using a lateral approach. At the conclusion of the procedure a ribbon shaped tissue marker clip was deployed into the biopsy cavity. Follow up 2 view mammogram was performed and dictated separately. There was a moderate post biopsy hematoma in the right breast. Bleeding was completely stopped prior to the patient leaving the office. Patient was given instructions for home care. IMPRESSION: Ultrasound guided biopsy of a mass in the right breast at 6 o'clock. No apparent complications. Electronically Signed: By: Emmaline Kluver M.D. On: 02/20/2023 09:08  MM LT BREAST BX W LOC DEV 1ST LESION IMAGE BX SPEC STEREO GUIDE  Addendum Date: 02/21/2023   ADDENDUM REPORT: 02/21/2023 16:20 ADDENDUM: PATHOLOGY revealed: Site 1. Breast, RIGHT, needle core biopsy, 6 o'clock, 4 cm fn, ribbon clip - INVASIVE MAMMARY CARCINOMA, SEE NOTE TUBULE FORMATION: SCORE 3 - NUCLEAR PLEOMORPHISM: SCORE 2 MITOTIC COUNT: SCORE 1 - TOTAL SCORE: 6 - OVERALL GRADE: 2 - LYMPHOVASCULAR INVASION: NOT IDENTIFIED - CANCER LENGTH: 1.5 CM - CALCIFICATIONS: NOT IDENTIFIED - OTHER FINDINGS: NONE Pathology  results are CONCORDANT with imaging findings, per Dr. Emmaline Kluver. PATHOLOGY revealed: Site 2. Breast, LEFT, needle core biopsy, upper outer, x clip - FIBROCYSTIC CHANGE WITH CALCIFICATIONS - NEGATIVE FOR CARCINOMA Pathology results are CONCORDANT with imaging findings, per Dr. Emmaline Kluver. Pathology results and recommendations below were discussed with patient by telephone on 02/21/2023. Patient reported biopsy site within normal  limits with slight tenderness at the site. Post biopsy care instructions were reviewed, questions were answered and my direct phone number was provided to patient. Patient was instructed to call [facility] if any concerns or questions arise related to the biopsy. The patient was referred to the [facility] at [facility] with appointment on 03/06/2023. Pathology results reported by Lynett Grimes, RN on 02/21/2023. Electronically Signed   By: Emmaline Kluver M.D.   On: 02/21/2023 16:20   Result Date: 02/21/2023 CLINICAL DATA:  62 year old female presenting for biopsy of calcifications in the left breast. EXAM: LEFT BREAST STEREOTACTIC CORE NEEDLE BIOPSY COMPARISON:  Previous exam(s). FINDINGS: The patient and I discussed the procedure of stereotactic-guided biopsy including benefits and alternatives. We discussed the high likelihood of a successful procedure. We discussed the risks of the procedure including infection, bleeding, tissue injury, clip migration, and inadequate sampling. Informed written consent was given. The usual time out protocol was performed immediately prior to the procedure. Using sterile technique and 1% Lidocaine as local anesthetic, under stereotactic guidance, a 9 gauge vacuum assisted device was used to perform core needle biopsy of calcifications in the upper outer left breast using a lateral approach. Specimen radiograph was performed showing several specimens with  calcifications. Specimens with calcifications are identified for pathology. Lesion quadrant: Upper outer quadrant At the conclusion of the procedure, an X shaped tissue marker clip was deployed into the biopsy cavity. Follow-up 2-view mammogram was performed and dictated separately. IMPRESSION: Stereotactic-guided biopsy of calcifications in the upper outer left breast. No apparent complications. Electronically Signed: By: Emmaline Kluver M.D. On: 02/20/2023 09:05  MM CLIP PLACEMENT RIGHT  Result Date: 02/20/2023 CLINICAL DATA:  Post procedure mammogram for clip placement EXAM: 3D DIAGNOSTIC BILATERAL MAMMOGRAM POST ULTRASOUND AND STEREOTACTIC BIOPSY COMPARISON:  Previous exam(s). FINDINGS: 3D Mammographic images were obtained following ultrasound guided biopsy of a mass in the right breast at 6 o'clock. The ribbon biopsy marking clip is in expected position at the site of biopsy. 3D Mammographic images were obtained following stereotactic guided biopsy of calcifications in the upper outer left breast. The X biopsy marking clip is in expected position at the site of biopsy. IMPRESSION: Appropriate positioning of the ribbon shaped biopsy marking clip in the right breast and the X shaped biopsy marking clip in the left breast. Final Assessment: Post Procedure Mammograms for Marker Placement Electronically Signed   By: Emmaline Kluver M.D.   On: 02/20/2023 09:04  MM CLIP PLACEMENT LEFT  Result Date: 02/20/2023 CLINICAL DATA:  Post procedure mammogram for clip placement EXAM: 3D DIAGNOSTIC BILATERAL MAMMOGRAM POST ULTRASOUND AND STEREOTACTIC BIOPSY COMPARISON:  Previous exam(s). FINDINGS: 3D Mammographic images were obtained following ultrasound guided biopsy of a mass in the right breast at 6 o'clock. The ribbon biopsy marking clip is in expected position at the site of biopsy. 3D Mammographic images were obtained following stereotactic guided biopsy of calcifications in the upper outer left breast. The X  biopsy marking clip is in expected position at the site of biopsy. IMPRESSION: Appropriate positioning of the ribbon shaped biopsy marking clip in the right breast and the X shaped biopsy marking clip in the left breast. Final Assessment: Post Procedure Mammograms for Marker Placement Electronically Signed   By: Emmaline Kluver M.D.   On: 02/20/2023 09:04  MM 3D DIAGNOSTIC MAMMOGRAM BILATERAL BREAST  Result Date: 02/12/2023 CLINICAL DATA:  Palpable right breast lump. EXAM: DIGITAL DIAGNOSTIC BILATERAL MAMMOGRAM WITH TOMOSYNTHESIS;  ULTRASOUND RIGHT BREAST LIMITED TECHNIQUE: Bilateral digital diagnostic mammography and breast tomosynthesis was performed.; Targeted ultrasound examination of the right breast was performed COMPARISON:  Previous exam(s). ACR Breast Density Category c: The breasts are heterogeneously dense, which may obscure small masses. FINDINGS: A radiopaque BB was placed at the site of the patient's palpable lump in the inferior right breast. There is a developing focal asymmetry with associated architectural distortion deep to the radiopaque BB. Within the left breast, there is a 7 mm group of pleomorphic calcifications in the upper outer quadrant at mid to posterior depth. No other suspicious findings in the remainder of either breast. Targeted ultrasound is performed, showing an irregular, hypoechoic mass at the 6 o'clock position 4 cm from the nipple on the right. It measures 1.4 x 1.3 x 1.2 cm without associated vascularity. This corresponds with the mammographic finding. Evaluation of the right axilla demonstrates no suspicious lymphadenopathy. IMPRESSION: 1. Suspicious right breast mass corresponding with the patient's palpable lump. Recommend ultrasound-guided biopsy. 2. Indeterminate left breast calcifications. Recommend stereotactic biopsy. 3. No suspicious right axillary lymphadenopathy. RECOMMENDATION: Ultrasound-guided biopsy of the right breast and stereotactic guided biopsy of the  left breast. I have discussed the findings and recommendations with the patient. If applicable, a reminder letter will be sent to the patient regarding the next appointment. BI-RADS CATEGORY  4: Suspicious. Electronically Signed   By: Sande Brothers M.D.   On: 02/12/2023 12:13  [place] LIMITED ULTRASOUND INCLUDING AXILLA RIGHT BREAST  Result Date: 02/12/2023 CLINICAL DATA:  Palpable right breast lump. EXAM: DIGITAL DIAGNOSTIC BILATERAL MAMMOGRAM WITH TOMOSYNTHESIS; ULTRASOUND RIGHT BREAST LIMITED TECHNIQUE: Bilateral digital diagnostic mammography and breast tomosynthesis was performed.; Targeted ultrasound examination of the right breast was performed COMPARISON:  Previous exam(s). ACR Breast Density Category c: The breasts are heterogeneously dense, which may obscure small masses. FINDINGS: A radiopaque BB was placed at the site of the patient's palpable lump in the inferior right breast. There is a developing focal asymmetry with associated architectural distortion deep to the radiopaque BB. Within the left breast, there is a 7 mm group of pleomorphic calcifications in the upper outer quadrant at mid to posterior depth. No other suspicious findings in the remainder of either breast. Targeted ultrasound is performed, showing an irregular, hypoechoic mass at the 6 o'clock position 4 cm from the nipple on the right. It measures 1.4 x 1.3 x 1.2 cm without associated vascularity. This corresponds with the mammographic finding. Evaluation of the right axilla demonstrates no suspicious lymphadenopathy. IMPRESSION: 1. Suspicious right breast mass corresponding with the patient's palpable lump. Recommend ultrasound-guided biopsy. 2. Indeterminate left breast calcifications. Recommend stereotactic biopsy. 3. No suspicious right axillary lymphadenopathy. RECOMMENDATION: Ultrasound-guided biopsy of the right breast and stereotactic guided biopsy of the left breast. I have discussed the findings and recommendations with the  patient. If applicable, a reminder letter will be sent to the patient regarding the next appointment. BI-RADS CATEGORY  4: Suspicious. Electronically Signed   By: Sande Brothers M.D.   On: 02/12/2023 12:13      IMPRESSION/PLAN: 1. Stage IA, [id number], grade 2, ER/PR positive invasive lobular carcinoma of the right breast. Dr. Mitzi Hansen discusses the pathology findings and reviews the nature of early stage right breast disease. The consensus from the breast conference includes breast conservation with lumpectomy with  sentinel node biopsy.  Dr. Al Pimple anticipates Oncotype Dx score to determine a role for systemic therapy. Provided that chemotherapy is not indicated, the patient's course would then be followed by external  radiotherapy to the breast  to reduce risks of local recurrence followed by antiestrogen therapy. We discussed the risks, benefits, short, and long term effects of radiotherapy, as well as the curative intent, and the patient is interested in proceeding. Dr. Mitzi Hansen discusses the delivery and logistics of radiotherapy and anticipates a course of 4 or up to 6 1/2 weeks of radiotherapy to the right breast. We will see her back a few weeks after surgery to discuss the simulation process and anticipate we starting radiotherapy about 4-6 weeks after surgery.  2. Possible genetic predisposition to malignancy. The patient is a candidate for genetic testing given her personal and family history. She will meet with our geneticist today in clinic.   In a visit lasting 60 minutes, greater than 50% of the time was spent face to face reviewing her case, as well as in preparation of, discussing, and coordinating the patient's care.  The above documentation reflects my direct findings during this shared patient visit. Please see the separate note by Dr. Mitzi Hansen on this date for the remainder of the patient's plan of care.    Osker Mason, [facility]    **Disclaimer: This note was dictated with voice  recognition software. Similar sounding words can inadvertently be transcribed and this note may contain transcription errors which may not have been corrected upon publication of note.**

## 2023-03-06 NOTE — Research (Signed)
[employer] 2021-05 - Specimen Collection Study to Evaluate Biomarkers in Subjects with Cancer    03/06/23  CONSENT INTRO: Patient Abigail Foley was identified by Dr. Al Pimple as a potential candidate for the above listed study.  This Clinical Research Coordinator met with Abigail Foley, [id number], on 03/06/23 in a manner and location that ensures patient privacy to discuss participation in the above listed research study.  Patient is Accompanied by her spouse .  A copy of the informed consent document with embedded HIPAA language was provided to the patient.  Patient reads, speaks, and understands [place].   Patient was provided with the business card of this Coordinator and encouraged to contact the research team with any questions.  Approximately 10 minutes were spent with the patient reviewing the informed consent documents.  Patient was provided the option of taking informed consent documents home to review and was encouraged to review at their convenience with their support network, including other care providers. Patient took the consent documents home to review. Patient is okay with a follow-up early next week. Thanked patient for her time and consideration of the above mentioned study.   Merri Brunette, RT(R)(T) Clinical Research Coordinator

## 2023-03-06 NOTE — Assessment & Plan Note (Signed)
This is a very pleasant 62 year old postmenopausal female patient with newly diagnosed right breast grade 2 ILC, ER 90% positive strong staining PR 5% positive strong staining Ki-67 of 3% HER2 1+ refer to breast [facility] for additional recommendations.  She is healthy at baseline.  Given small tumor, strong ER positivity she will proceed with upfront surgery followed by Oncotype DX testing.  We have discussed the following details about Oncotype DX.  We have discussed about Oncotype Dx score which is a well validated prognostic scoring system which can predict outcome with endocrine therapy alone and whether chemotherapy reduces recurrence.  Typically in patients with ER positive cancers that are node negative if the RS score is high typically greater than or equal to 26, chemotherapy is recommended.  In women with intermediate recurrence score younger than 50, there can still be some role for chemotherapy in addition to endocrine therapy especially if the recurrence score is between 21-25. If chemotherapy is needed, this will precede radiation and then after radiation she will continue on antiestrogen therapy.  We have also discussed the following details about antiestrogen therapy.  We have focused our discussion mostly on aromatase inhibitors. We have discussed options for antiestrogen therapy today  With regards to aromatase inhibitors, we discussed mechanism of action, adverse effects including but not limited to post menopausal symptoms, arthralgias, myalgias, increased risk of cardiovascular events and bone loss.   She will return to clinic after surgery to review Oncotype DX results and to discuss any additional recommendations.

## 2023-03-06 NOTE — Progress Notes (Signed)
[facility] CONSULT NOTE  Patient Care Team: Abigail Broker, MD as PCP - General (Internal Medicine) Abigail Moulds, MD as Consulting Physician (Hematology and Oncology) Abigail Miyamoto, MD as Consulting Physician (General Surgery) Abigail Puffer, MD as Consulting Physician (Radiation Oncology) Abigail Proud, RN as Oncology Nurse Navigator Abigail Angelica, RN as Oncology Nurse Navigator  CHIEF COMPLAINTS/PURPOSE OF CONSULTATION:  Newly diagnosed breast cancer  HISTORY OF PRESENTING ILLNESS:  Abigail Foley 62 y.o. female is here because of recent diagnosis of right   I reviewed her records extensively and collaborated the history with the patient.  SUMMARY OF ONCOLOGIC HISTORY: Oncology History  Malignant neoplasm of lower-outer quadrant of right breast of female, estrogen receptor positive (HCC)  02/12/2023 Mammogram   Diagnostic mammogram showed suspicious right breast mass corresponding with patient's palpable lump, recommend ultrasound-guided biopsy.  Indeterminate left breast calcs, recommend stereotactic biopsy.  No suspicious right axillary lymphadenopathy.   02/20/2023 Pathology Results   Right breast needle core biopsy showed overall grade 2 invasive mammary carcinoma, consistent with lobular phenotype, left breast needle core biopsy in the upper outer breast showed Fibrocystic changes with calcs, negative for carcinoma.  Prognostics from the invasive lobular carcinoma showed ER 90% positive strong staining PR 5% positive strong staining Ki-67 of 3% HER2 1+   03/05/2023 Initial Diagnosis   Malignant neoplasm of lower-outer quadrant of right breast of female, estrogen receptor positive (HCC)   03/06/2023 Cancer Staging   Staging form: Breast, AJCC 8th Edition - Clinical stage from 03/06/2023: Stage IA (cT1c, cN0, cM0, G2, ER+, PR+, HER2-) - Signed by Ronny Bacon, PA-C on 03/06/2023 Stage prefix: Initial diagnosis Histologic grading system: 3  grade system    Abigail Foley is here for an initial visit with her husband today.  She works for [employer] and is very healthy at baseline.  She is now on Ozempic for weight loss and has lost about 29 pounds.  She has felt this breast lump a few months ago but with the weight loss it was more prominent and hence underwent further imaging.  She does have family history of breast cancer in sister and maternal aunt with colon cancer.  She has 2 kids, aged child but was 7, history of breast-feeding her first child.  She has used birth control for about 8 years.  She used to be a smoker but quit in March 2024.  She was on and off smoker.  No history of alcohol or other recreational drugs.  Rest of the pertinent 10 point ROS reviewed and negative.   MEDICAL HISTORY:  Past Medical History:  Diagnosis Date   Actinic keratosis    Allergy    Anemia    Anxiety    Asthma    Blood transfusion without reported diagnosis    Hot flashes     SURGICAL HISTORY: Past Surgical History:  Procedure Laterality Date   ABLATION     BREAST BIOPSY Left 02/20/2023   MM LT BREAST BX W LOC DEV 1ST LESION IMAGE BX SPEC STEREO GUIDE 02/20/2023 [facility]   BREAST BIOPSY Right 02/20/2023   [place] RT BREAST BX W LOC DEV 1ST LESION IMG BX SPEC US GUIDE 02/20/2023 [facility]   CESAREAN SECTION     DILATION AND CURETTAGE OF UTERUS     ECTOPIC PREGNANCY SURGERY     TUBAL LIGATION     WISDOM TOOTH EXTRACTION      SOCIAL HISTORY: Social History   Socioeconomic History  Marital status: Married    Spouse name: Not on file   Number of children: Not on file   Years of education: Not on file   Highest education level: Not on file  Occupational History   Not on file  Tobacco Use   Smoking status: Former    Types: Cigarettes    Quit date: 10/01/2005    Years since quitting: 17.4   Smokeless tobacco: Never  Substance and Sexual Activity   Alcohol use: Yes    Comment: occasional   Drug use: No   Sexual  activity: Not on file  Other Topics Concern   Not on file  Social History Narrative   Not on file   Social Determinants of Health   Financial Resource Strain: Not on file  Food Insecurity: Not on file  Transportation Needs: Not on file  Physical Activity: Not on file  Stress: Not on file  Social Connections: Not on file  Intimate Partner Violence: Not on file    FAMILY HISTORY: Family History  Problem Relation Age of Onset   Hypertension Mother    Diabetes Father    Hyperlipidemia Father    Hyperlipidemia Sister    Breast cancer Sister    Cancer Brother    Hyperlipidemia Brother    Hypertension Brother    Esophageal cancer Brother    Colon cancer Maternal Aunt    Liver cancer Maternal Uncle    Pancreatic cancer Neg Hx    Rectal cancer Neg Hx    Stomach cancer Neg Hx     ALLERGIES:  is allergic to cefaclor and sulfa antibiotics.  MEDICATIONS:  Current Outpatient Medications  Medication Sig Dispense Refill   atorvastatin (LIPITOR) 10 MG tablet Take 1 tablet (10 mg total) by mouth daily. 90 tablet 3   buPROPion (WELLBUTRIN XL) 150 MG 24 hr tablet Take 1 tablet (150 mg total) by mouth daily. 90 tablet 1   Cholecalciferol (VITAMIN D3) 2000 UNITS capsule Take 5,000 Units by mouth daily.      loratadine (CLARITIN) 10 MG tablet Take 10 mg by mouth daily.     Zinc 50 MG TABS Take by mouth daily.     chlorhexidine (HIBICLENS) 4 % external liquid Apply topically once a week. 120 mL 0   Semaglutide-Weight Management 1.7 MG/0.75ML SOAJ Inject 1.7 mg into the skin once a week for 28 days. 3 mL 0   [START ON 03/16/2023] Semaglutide-Weight Management 2.4 MG/0.75ML SOAJ Inject 2.4 mg into the skin once a week for 28 days. 3 mL 0   UNABLE TO FIND Take 1 tablet by mouth daily. Med Name: Merdis Delay     valACYclovir (VALTREX) 500 MG tablet Take 1 tablet (500 mg total) by mouth 2 (two) times daily. 90 tablet 1   No current facility-administered medications for this visit.    REVIEW  OF SYSTEMS:   Constitutional: Denies fevers, chills or abnormal night sweats Eyes: Denies blurriness of vision, double vision or watery eyes Ears, nose, mouth, throat, and face: Denies mucositis or sore throat Respiratory: Denies cough, dyspnea or wheezes Cardiovascular: Denies palpitation, chest discomfort or lower extremity swelling Gastrointestinal:  Denies nausea, heartburn or change in bowel habits Skin: Denies abnormal skin rashes Lymphatics: Denies new lymphadenopathy or easy bruising Neurological:Denies numbness, tingling or new weaknesses Behavioral/Psych: Mood is stable, no new changes  Breast: Denies any palpable lumps or discharge All other systems were reviewed with the patient and are negative.  PHYSICAL EXAMINATION: ECOG PERFORMANCE STATUS: 0 - Asymptomatic  Vitals:   03/06/23 0854  BP: 90/60  Pulse: 80  Resp: 16  Temp: 97.9 F (36.6 C)  SpO2: 100%   Filed Weights   03/06/23 0854  Weight: 149 lb 3.2 oz (67.7 kg)    GENERAL:alert, no distress and comfortable Right breast with postbiopsy changes.  Mass not well-defined given the bruising.  No regional adenopathy.  No lower extremity edema.  LABORATORY DATA:  I have reviewed the data as listed Lab Results  Component Value Date   WBC 5.4 03/06/2023   HGB 12.0 03/06/2023   HCT 36.0 03/06/2023   MCV 95.2 03/06/2023   PLT 264 03/06/2023   Lab Results  Component Value Date   NA 139 03/06/2023   K 4.0 03/06/2023   CL 106 03/06/2023   CO2 26 03/06/2023    RADIOGRAPHIC STUDIES: I have personally reviewed the radiological reports and agreed with the findings in the report.  ASSESSMENT AND PLAN:  Malignant neoplasm of lower-outer quadrant of right breast of female, estrogen receptor positive (HCC) This is a very pleasant 62 year old postmenopausal female patient with newly diagnosed right breast grade 2 ILC, ER 90% positive strong staining PR 5% positive strong staining Ki-67 of 3% HER2 1+ refer to breast  MDC for additional recommendations.  She is healthy at baseline.  Given small tumor, strong ER positivity she will proceed with upfront surgery followed by Oncotype DX testing.  We have discussed the following details about Oncotype DX.  We have discussed about Oncotype Dx score which is a well validated prognostic scoring system which can predict outcome with endocrine therapy alone and whether chemotherapy reduces recurrence.  Typically in patients with ER positive cancers that are node negative if the RS score is high typically greater than or equal to 26, chemotherapy is recommended.  In women with intermediate recurrence score younger than 50, there can still be some role for chemotherapy in addition to endocrine therapy especially if the recurrence score is between 21-25. If chemotherapy is needed, this will precede radiation and then after radiation she will continue on antiestrogen therapy.  We have also discussed the following details about antiestrogen therapy.  We have focused our discussion mostly on aromatase inhibitors. We have discussed options for antiestrogen therapy today  With regards to aromatase inhibitors, we discussed mechanism of action, adverse effects including but not limited to post menopausal symptoms, arthralgias, myalgias, increased risk of cardiovascular events and bone loss.   She will return to clinic after surgery to review Oncotype DX results and to discuss any additional recommendations.    All questions were answered. The patient knows to call the clinic with any problems, questions or concerns.    Abigail Moulds, MD 03/06/23

## 2023-03-07 ENCOUNTER — Encounter: Payer: Self-pay | Admitting: Genetic Counselor

## 2023-03-07 DIAGNOSIS — Z803 Family history of malignant neoplasm of breast: Secondary | ICD-10-CM | POA: Insufficient documentation

## 2023-03-07 NOTE — Progress Notes (Addendum)
REFERRING PROVIDER: Rachel Moulds, MD  PRIMARY PROVIDER:  Myrlene Broker, MD  PRIMARY REASON FOR VISIT:  1. Malignant neoplasm of lower-outer quadrant of right breast of female, estrogen receptor positive (HCC)   2. Family history of breast cancer     HISTORY OF PRESENT ILLNESS:   Abigail Foley, a 62 y.o. female, was seen for a [place] cancer genetics consultation during the breast multidisciplinary clinic at the request of Dr. Al Pimple due to a personal and family history of cancer.  Abigail Foley presents to clinic today to discuss the possibility of a hereditary predisposition to cancer, to discuss genetic testing, and to further clarify her future cancer risks, as well as potential cancer risks for family members.   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   CANCER HISTORY:  Oncology History  Malignant neoplasm of lower-outer quadrant of right breast of female, estrogen receptor positive (HCC)  02/12/2023 Mammogram   Diagnostic mammogram showed suspicious right breast mass corresponding with patient's palpable lump, recommend ultrasound-guided biopsy.  Indeterminate left breast calcs, recommend stereotactic biopsy.  No suspicious right axillary lymphadenopathy.   02/20/2023 Pathology Results   Right breast needle core biopsy showed overall grade 2 invasive mammary carcinoma, consistent with lobular phenotype, left breast needle core biopsy in the upper outer breast showed Fibrocystic changes with calcs, negative for carcinoma.  Prognostics from the invasive lobular carcinoma showed ER 90% positive strong staining PR 5% positive strong staining Ki-67 of 3% HER2 1+   03/05/2023 Initial Diagnosis   Malignant neoplasm of lower-outer quadrant of right breast of female, estrogen receptor positive (HCC)   03/06/2023 Cancer Staging   Staging form: Breast, AJCC 8th Edition - Clinical  stage from 03/06/2023: Stage IA (cT1c, cN0, cM0, G2, ER+, PR+, HER2-) - Signed by Ronny Bacon, PA-C on 03/06/2023 Stage prefix: Initial diagnosis Histologic grading system: 3 grade system      RISK FACTORS:  Menarche was at age 56-12.  First live birth at age 13.  OCP use for approximately 8 years.  Ovaries intact: yes.  Uterus intact: yes.  Menopausal status: postmenopausal.  HRT use: 0 years. Colonoscopy: yes;  one tubular adenoma was identified on 2019 colonoscopy . Mammogram within the last year: yes Any excessive radiation exposure in the past: no  Past Medical History:  Diagnosis Date   Actinic keratosis    Allergy    Anemia    Anxiety    Asthma    Blood transfusion without reported diagnosis    Hot flashes     Past Surgical History:  Procedure Laterality Date   ABLATION     BREAST BIOPSY Left 02/20/2023   MM LT BREAST BX W LOC DEV 1ST LESION IMAGE BX SPEC STEREO GUIDE 02/20/2023 [facility]   BREAST BIOPSY Right 02/20/2023   [place] RT BREAST BX W LOC DEV 1ST LESION IMG BX SPEC US GUIDE 02/20/2023 [facility]   CESAREAN SECTION     DILATION AND CURETTAGE OF UTERUS     ECTOPIC PREGNANCY SURGERY     TUBAL LIGATION     WISDOM TOOTH EXTRACTION      Social History   Socioeconomic History   Marital status: Married    Spouse name: Not on file   Number of children: Not on file   Years of education: Not on file   Highest education level: Not on file  Occupational History  Not on file  Tobacco Use   Smoking status: Former    Types: Cigarettes    Quit date: 10/01/2005    Years since quitting: 17.4   Smokeless tobacco: Never  Substance and Sexual Activity   Alcohol use: Yes    Comment: occasional   Drug use: No   Sexual activity: Not on file  Other Topics Concern   Not on file  Social History Narrative   Not on file   Social Determinants of Health   Financial Resource Strain: Not on file  Food Insecurity: No Food Insecurity (03/06/2023)    Hunger Vital Sign    Worried About Running Out of Food in the Last Year: Never true    Ran Out of Food in the Last Year: Never true  Transportation Needs: No Transportation Needs (03/06/2023)   PRAPARE - Administrator, Civil Service (Medical): No    Lack of Transportation (Non-Medical): No  Physical Activity: Not on file  Stress: Not on file  Social Connections: Not on file     FAMILY HISTORY:  We obtained a detailed, 4-generation family history.  Significant diagnoses are listed below: Family History  Problem Relation Age of Onset   Hypertension Mother    Diabetes Father    Hyperlipidemia Father    Hyperlipidemia Sister    Breast cancer Sister 64   Hyperlipidemia Brother    Hypertension Brother    Esophageal cancer Brother 79 - 31   Colon cancer Maternal Aunt 72 - 69   Liver cancer Maternal Uncle    Stomach cancer Maternal Uncle    Leukemia Paternal Uncle    Skin cancer Paternal Grandmother    Cancer Paternal Grandmother        vulvar cancer   Pancreatic cancer Neg Hx    Rectal cancer Neg Hx      Abigail Foley's sister was diagnosed with breast cancer at age 45 and her brother was diagnosed with esophageal cancer in his 75s. Abigail Foley had two maternal uncles, one was diagnosed with liver cancer and one was diagnosed with stomach cancer, both are deceased. She has four maternal aunts and one was diagnosed with colon cancer in her 50s/60s. Abigail Foley has four paternal uncles, one was diagnosed with leukemia. Her paternal grandmother was diagnosed with skin cancer (unknown type) and vulvar cancer, she is deceased. Abigail Foley is unaware of previous family history of genetic testing for hereditary cancer risks. There is no reported Ashkenazi Jewish ancestry.   GENETIC COUNSELING ASSESSMENT: [name] is a 62 y.o. female with a personal and family history of cancer which is somewhat suggestive of a hereditary cancer syndrome and predisposition to cancer. We,  therefore, discussed and recommended the following at today's visit.   DISCUSSION: We discussed that 5 - 10% of cancer is hereditary, with most cases of hereditary breast cancer associated with mutations in BRCA1/2.  There are other genes that can be associated with hereditary breast cancer syndromes. Type of cancer risk and level of risk are gene-specific. We discussed that testing is beneficial for several reasons including knowing how to follow individuals after completing their treatment, identifying whether potential treatment options would be beneficial, and understanding if other family members could be at risk for cancer and allowing them to undergo genetic testing.   We reviewed the characteristics, features and inheritance patterns of hereditary cancer syndromes. We also discussed genetic testing, including the appropriate family members to test, the process of testing, insurance coverage and turn-around-time  for results. We discussed the implications of a negative, positive and/or variant of uncertain significant result. In order to get genetic test results in a timely manner so that Ms. Conti can use these genetic test results for surgical decisions, we recommended Ms. Stoen pursue genetic testing for the BRCAplus. Once complete, we recommend Ms. Hofman pursue reflex genetic testing to a more comprehensive gene panel.   Ms. Batdorf elected to have [facility] Common Cancer Panel. The Common Hereditary Cancers Panel offered by [facility] includes sequencing and/or deletion duplication testing of the following 48 genes: APC, ATM, AXIN2, BAP1, BARD1, BMPR1A, BRCA1, BRCA2, BRIP1, CDH1, CDK4, CDKN2A (p14ARF and p16INK4a only), CHEK2, CTNNA1, DICER1, EPCAM (Deletion/duplication testing only), FH, GREM1 (promoter region duplication testing only), HOXB13, KIT, MBD4, MEN1, MLH1, MSH2, MSH3, MSH6, MUTYH, NF1, NHTL1, PALB2, PDGFRA, PMS2, POLD1, POLE, PTEN, RAD51C, RAD51D, SDHA (sequencing analysis only  except exon 14), SDHB, SDHC, SDHD, SMAD4, SMARCA4. STK11, TP53, TSC1, TSC2, and VHL.  Based on Ms. Tew's personal and family history of cancer, she meets medical criteria for genetic testing. Despite that she meets criteria, she may still have an out of pocket cost. We discussed that if her out of pocket cost for testing is over $100, the laboratory should contact them to discuss self-pay prices, patient pay assistance programs, if applicable, and other billing options.   PLAN: After considering the risks, benefits, and limitations, Ms. Zabawa provided informed consent to pursue genetic testing and the blood sample was sent to [facility] for analysis of the Common Cancer Panel. Results should be available within approximately 1-2 weeks' time, at which point they will be disclosed by telephone to Ms. Skufca, as will any additional recommendations warranted by these results. Ms. Knarr will receive a summary of her genetic counseling visit and a copy of her results once available. This information will also be available in Epic.   Ms. Mollett's questions were answered to her satisfaction today. Our contact information was provided should additional questions or concerns arise. Thank you for the referral and allowing [place] to share in the care of your patient.   Lalla Brothers, MS, [facility] Genetic Counselor [email or web address] (P) [phone]  The patient was seen for a total of 20 minutes in face-to-face genetic counseling.  The patient brought her husband.  Drs. Pamelia Hoit and/or Mosetta Putt were available to discuss this case as needed.  _______________________________________________________________________ For Office Staff:  Number of people involved in session: 2 Was an Intern/ student involved with case: no

## 2023-03-11 ENCOUNTER — Telehealth: Payer: Self-pay | Admitting: Emergency Medicine

## 2023-03-11 NOTE — Telephone Encounter (Signed)
[employer] 2021-05 - Specimen Collection Study to Evaluate Biomarkers in Subjects with Cancer   Called to f/u on patient interest in study.  Patient would like more time to review consents.  Patient has contact information, states she will f/u if interested.  Lurena Joiner '[employer]' Dairl Ponder, RN, BSN, [facility] Clinical Research Nurse I 03/11/23 11:05 AM

## 2023-03-14 ENCOUNTER — Encounter: Payer: Self-pay | Admitting: *Deleted

## 2023-03-14 ENCOUNTER — Telehealth: Payer: Self-pay | Admitting: Genetic Counselor

## 2023-03-14 ENCOUNTER — Encounter: Payer: Self-pay | Admitting: Genetic Counselor

## 2023-03-14 ENCOUNTER — Telehealth: Payer: Self-pay | Admitting: *Deleted

## 2023-03-14 DIAGNOSIS — Z1379 Encounter for other screening for genetic and chromosomal anomalies: Secondary | ICD-10-CM | POA: Insufficient documentation

## 2023-03-14 NOTE — Telephone Encounter (Signed)
Left vm regarding [facility] from 6.5.24. Contact information provided for questions or needs.

## 2023-03-14 NOTE — Telephone Encounter (Signed)
I contacted Ms. Salsberry to discuss her genetic testing results. No pathogenic variants were identified in the 48 genes analyzed. Detailed clinic note to follow.  The test report has been scanned into EPIC and is located under the Molecular Pathology section of the Results Review tab.  A portion of the result report is included below for reference.   Lalla Brothers, MS, [facility] Genetic Counselor [email or web address] (P) [phone]

## 2023-03-15 ENCOUNTER — Encounter: Payer: Self-pay | Admitting: Genetic Counselor

## 2023-03-15 ENCOUNTER — Telehealth: Payer: Self-pay | Admitting: Emergency Medicine

## 2023-03-15 ENCOUNTER — Telehealth: Payer: Self-pay | Admitting: Hematology and Oncology

## 2023-03-15 ENCOUNTER — Encounter: Payer: Self-pay | Admitting: Internal Medicine

## 2023-03-15 ENCOUNTER — Ambulatory Visit: Payer: Self-pay | Admitting: Genetic Counselor

## 2023-03-15 DIAGNOSIS — Z1379 Encounter for other screening for genetic and chromosomal anomalies: Secondary | ICD-10-CM

## 2023-03-15 NOTE — Telephone Encounter (Signed)
Patient is aware of upcoming appointment times and dates.

## 2023-03-15 NOTE — Progress Notes (Signed)
HPI:   Abigail Foley was previously seen in the [facility] due to a personal and family history of cancer and concerns regarding a hereditary predisposition to cancer. Please refer to our prior cancer genetics clinic note for more information regarding our discussion, assessment and recommendations, at the time. Abigail Foley's recent genetic test results were disclosed to her, as were recommendations warranted by these results. These results and recommendations are discussed in more detail below.  CANCER HISTORY:  Oncology History  Malignant neoplasm of lower-outer quadrant of right breast of female, estrogen receptor positive (HCC)  02/12/2023 Mammogram   Diagnostic mammogram showed suspicious right breast mass corresponding with patient's palpable lump, recommend ultrasound-guided biopsy.  Indeterminate left breast calcs, recommend stereotactic biopsy.  No suspicious right axillary lymphadenopathy.   02/20/2023 Pathology Results   Right breast needle core biopsy showed overall grade 2 invasive mammary carcinoma, consistent with lobular phenotype, left breast needle core biopsy in the upper outer breast showed Fibrocystic changes with calcs, negative for carcinoma.  Prognostics from the invasive lobular carcinoma showed ER 90% positive strong staining PR 5% positive strong staining Ki-67 of 3% HER2 1+   03/05/2023 Initial Diagnosis   Malignant neoplasm of lower-outer quadrant of right breast of female, estrogen receptor positive (HCC)   03/06/2023 Cancer Staging   Staging form: Breast, AJCC 8th Edition - Clinical stage from 03/06/2023: Stage IA (cT1c, cN0, cM0, G2, ER+, PR+, HER2-) - Signed by Ronny Bacon, PA-C on 03/06/2023 Stage prefix: Initial diagnosis Histologic grading system: 3 grade system    Genetic Testing   [facility] Common Cancer Panel+RNA was Negative. Report date is 03/12/2023.  The Common Hereditary Cancers Panel offered by [facility] includes sequencing  and/or deletion duplication testing of the following 48 genes: APC, ATM, AXIN2, BAP1, BARD1, BMPR1A, BRCA1, BRCA2, BRIP1, CDH1, CDK4, CDKN2A (p14ARF and p16INK4a only), CHEK2, CTNNA1, DICER1, EPCAM (Deletion/duplication testing only), FH, GREM1 (promoter region duplication testing only), HOXB13, KIT, MBD4, MEN1, MLH1, MSH2, MSH3, MSH6, MUTYH, NF1, NHTL1, PALB2, PDGFRA, PMS2, POLD1, POLE, PTEN, RAD51C, RAD51D, SDHA (sequencing analysis only except exon 14), SDHB, SDHC, SDHD, SMAD4, SMARCA4. STK11, TP53, TSC1, TSC2, and VHL.     FAMILY HISTORY:  We obtained a detailed, 4-generation family history.  Significant diagnoses are listed below:      Family History  Problem Relation Age of Onset   Hypertension Mother     Diabetes Father     Hyperlipidemia Father     Hyperlipidemia Sister     Breast cancer Sister 34   Hyperlipidemia Brother     Hypertension Brother     Esophageal cancer Brother 67 - 23   Colon cancer Maternal Aunt 53 - 69   Liver cancer Maternal Uncle     Stomach cancer Maternal Uncle     Leukemia Paternal Uncle     Skin cancer Paternal Grandmother     Cancer Paternal Grandmother          vulvar cancer   Pancreatic cancer Neg Hx     Rectal cancer Neg Hx         Abigail Foley sister was diagnosed with breast cancer at age 65 and her brother was diagnosed with esophageal cancer in his 98s. Abigail Foley had two maternal uncles, one was diagnosed with liver cancer and one was diagnosed with stomach cancer, both are deceased. She has four maternal aunts and one was diagnosed with colon cancer in her 50s/60s. Abigail Foley has four paternal uncles, one was diagnosed  with leukemia. Her paternal grandmother was diagnosed with skin cancer (unknown type) and vulvar cancer, she is deceased. Abigail Foley is unaware of previous family history of genetic testing for hereditary cancer risks. There is no reported Ashkenazi Jewish ancestry.    GENETIC TEST RESULTS:  The [facility] Common Cancer  Panel found no pathogenic mutations.  The Common Hereditary Cancers Panel offered by [facility] includes sequencing and/or deletion duplication testing of the following 48 genes: APC, ATM, AXIN2, BAP1, BARD1, BMPR1A, BRCA1, BRCA2, BRIP1, CDH1, CDK4, CDKN2A (p14ARF and p16INK4a only), CHEK2, CTNNA1, DICER1, EPCAM (Deletion/duplication testing only), FH, GREM1 (promoter region duplication testing only), HOXB13, KIT, MBD4, MEN1, MLH1, MSH2, MSH3, MSH6, MUTYH, NF1, NHTL1, PALB2, PDGFRA, PMS2, POLD1, POLE, PTEN, RAD51C, RAD51D, SDHA (sequencing analysis only except exon 14), SDHB, SDHC, SDHD, SMAD4, SMARCA4. STK11, TP53, TSC1, TSC2, and VHL.   The test report has been scanned into EPIC and is located under the Molecular Pathology section of the Results Review tab.  A portion of the result report is included below for reference. Genetic testing reported out on 03/12/2023.     Even though a pathogenic variant was not identified, possible explanations for the cancer in the family may include: There may be no hereditary risk for cancer in the family. The cancers in Abigail Foley and/or her family may be due to other genetic or environmental factors. There may be a gene mutation in one of these genes that current testing methods cannot detect, but that chance is small. There could be another gene that has not yet been discovered, or that we have not yet tested, that is responsible for the cancer diagnoses in the family.  It is also possible there is a hereditary cause for the cancer in the family that Abigail Foley did not inherit.  Therefore, it is important to remain in touch with cancer genetics in the future so that we can continue to offer Abigail Foley the most up to date genetic testing.   ADDITIONAL GENETIC TESTING:  We discussed with Abigail Foley that her genetic testing was fairly extensive.  If there are genes identified to increase cancer risk that can be analyzed in the future, we would be happy to  discuss and coordinate this testing at that time.    CANCER SCREENING RECOMMENDATIONS:  Abigail Foley's test result is considered negative (normal).  This means that we have not identified a hereditary cause for her personal and family history of cancer at this time.   An individual's cancer risk and medical management are not determined by genetic test results alone. Overall cancer risk assessment incorporates additional factors, including personal medical history, family history, and any available genetic information that may result in a personalized plan for cancer prevention and surveillance. Therefore, it is recommended she continue to follow the cancer management and screening guidelines provided by her oncology and primary healthcare provider.  RECOMMENDATIONS FOR FAMILY MEMBERS:   Since she did not inherit a mutation in a cancer predisposition gene included on this panel, her children could not have inherited a mutation from her in one of these genes. Individuals in this family might be at some increased risk of developing cancer, over the general population risk, due to the family history of cancer. We recommend women in this family have a yearly mammogram beginning at age 26, or 2 years younger than the earliest onset of cancer, an annual clinical breast exam, and perform monthly breast self-exams.  Other members of the family may still carry a  pathogenic variant in one of these genes that Abigail Foley did not inherit. Based on the family history, we recommend her sister, who was diagnosed with breast cancer at age 1, have genetic counseling and testing.   FOLLOW-UP:  Cancer genetics is a rapidly advancing field and it is possible that new genetic tests will be appropriate for her and/or her family members in the future. We encouraged her to remain in contact with cancer genetics on an annual basis so we can update her personal and family histories and let her know of advances in cancer  genetics that may benefit this family.   Our contact number was provided. Ms. Varnum's questions were answered to her satisfaction, and she knows she is welcome to call us at anytime with additional questions or concerns.   Lalla Brothers, MS, [facility] Genetic Counselor [email or web address] (P) [phone]

## 2023-03-15 NOTE — Telephone Encounter (Signed)
[employer] 2021-05 - Specimen Collection Study to Evaluate Biomarkers in Subjects with Cancer   Called pt to f/u on interest in study.  Patient declined, states she has too many appts/commitments at this time to participate.  Denies any questions/concerns, aware to f/u as needed.  Lurena Joiner '[employer]' Dairl Ponder, RN, BSN, [facility] Clinical Research Nurse I 03/15/23 11:15 AM

## 2023-03-26 ENCOUNTER — Encounter: Payer: Self-pay | Admitting: Gastroenterology

## 2023-03-27 ENCOUNTER — Other Ambulatory Visit (HOSPITAL_COMMUNITY): Payer: Self-pay

## 2023-04-02 ENCOUNTER — Encounter (HOSPITAL_BASED_OUTPATIENT_CLINIC_OR_DEPARTMENT_OTHER): Payer: Self-pay | Admitting: Surgery

## 2023-04-02 ENCOUNTER — Other Ambulatory Visit: Payer: Self-pay

## 2023-04-03 NOTE — Progress Notes (Signed)

## 2023-04-08 DIAGNOSIS — C50911 Malignant neoplasm of unspecified site of right female breast: Secondary | ICD-10-CM | POA: Diagnosis not present

## 2023-04-09 ENCOUNTER — Encounter: Payer: Self-pay | Admitting: Gastroenterology

## 2023-04-09 ENCOUNTER — Ambulatory Visit (AMBULATORY_SURGERY_CENTER): Payer: 59 | Admitting: Gastroenterology

## 2023-04-09 VITALS — BP 96/43 | HR 61 | Temp 96.6°F | Resp 14 | Ht 65.0 in | Wt 151.0 lb

## 2023-04-09 DIAGNOSIS — D125 Benign neoplasm of sigmoid colon: Secondary | ICD-10-CM | POA: Diagnosis not present

## 2023-04-09 DIAGNOSIS — Z09 Encounter for follow-up examination after completed treatment for conditions other than malignant neoplasm: Secondary | ICD-10-CM | POA: Diagnosis not present

## 2023-04-09 DIAGNOSIS — Z1211 Encounter for screening for malignant neoplasm of colon: Secondary | ICD-10-CM | POA: Diagnosis not present

## 2023-04-09 DIAGNOSIS — J45909 Unspecified asthma, uncomplicated: Secondary | ICD-10-CM | POA: Diagnosis not present

## 2023-04-09 DIAGNOSIS — Z86010 Personal history of colonic polyps: Secondary | ICD-10-CM

## 2023-04-09 DIAGNOSIS — F419 Anxiety disorder, unspecified: Secondary | ICD-10-CM | POA: Diagnosis not present

## 2023-04-09 DIAGNOSIS — D123 Benign neoplasm of transverse colon: Secondary | ICD-10-CM | POA: Diagnosis not present

## 2023-04-09 MED ORDER — SODIUM CHLORIDE 0.9 % IV SOLN: 500.0000 mL | Freq: Once | INTRAVENOUS | Status: DC

## 2023-04-09 NOTE — Progress Notes (Signed)
A and O x3. Report to RN. Tolerated MAC anesthesia well. 

## 2023-04-09 NOTE — Progress Notes (Signed)
History & Physical  Primary Care Physician:  Myrlene Broker, MD Primary Gastroenterologist: Claudette Head, MD  Impression / Plan:  Personal history of adenomatous colon polyps for surveillance colonoscopy  CHIEF COMPLAINT:  Personal history of colon polyps   HPI: Abigail Foley is a 62 y.o. female with a personal history of adenomatous colon polyps for surveillance colonoscopy.    Past Medical History:  Diagnosis Date   Actinic keratosis    Allergy    Anemia    Anxiety    Asthma    Blood transfusion without reported diagnosis    Hot flashes     Past Surgical History:  Procedure Laterality Date   ABLATION     BREAST BIOPSY Left 02/20/2023   MM LT BREAST BX W LOC DEV 1ST LESION IMAGE BX SPEC STEREO GUIDE 02/20/2023 [facility]   BREAST BIOPSY Right 02/20/2023   [place] RT BREAST BX W LOC DEV 1ST LESION IMG BX SPEC US GUIDE 02/20/2023 [facility]   CESAREAN SECTION     DILATION AND CURETTAGE OF UTERUS     ECTOPIC PREGNANCY SURGERY     TUBAL LIGATION     WISDOM TOOTH EXTRACTION      Prior to Admission medications   Medication Sig Start Date End Date Taking? Authorizing Provider  atorvastatin (LIPITOR) 10 MG tablet Take 1 tablet (10 mg total) by mouth daily. 11/20/22  Yes Myrlene Broker, MD  buPROPion (WELLBUTRIN XL) 150 MG 24 hr tablet Take 1 tablet (150 mg total) by mouth daily. 11/20/22  Yes Myrlene Broker, MD  loratadine (CLARITIN) 10 MG tablet Take 10 mg by mouth daily.   Yes [provider]  chlorhexidine (HIBICLENS) 4 % external liquid Apply topically once a week. 03/01/22   Myrlene Broker, MD  Multiple Vitamin (MULTIVITAMIN ADULT PO) Take by mouth.    [provider]  valACYclovir (VALTREX) 500 MG tablet Take 1 tablet (500 mg total) by mouth 2 (two) times daily. 02/22/21   Myrlene Broker, MD  Zinc 50 MG TABS Take by mouth daily.    [provider]    Current Outpatient Medications  Medication  Sig Dispense Refill   atorvastatin (LIPITOR) 10 MG tablet Take 1 tablet (10 mg total) by mouth daily. 90 tablet 3   buPROPion (WELLBUTRIN XL) 150 MG 24 hr tablet Take 1 tablet (150 mg total) by mouth daily. 90 tablet 1   loratadine (CLARITIN) 10 MG tablet Take 10 mg by mouth daily.     chlorhexidine (HIBICLENS) 4 % external liquid Apply topically once a week. 120 mL 0   Multiple Vitamin (MULTIVITAMIN ADULT PO) Take by mouth.     valACYclovir (VALTREX) 500 MG tablet Take 1 tablet (500 mg total) by mouth 2 (two) times daily. 90 tablet 1   Zinc 50 MG TABS Take by mouth daily.     Current Facility-Administered Medications  Medication Dose Route Frequency Provider Last Rate Last Admin   0.9 %  sodium chloride infusion  500 mL Intravenous Once Meryl Dare, MD        Allergies as of 04/09/2023 - Review Complete 04/09/2023  Allergen Reaction Noted   Cefaclor Anaphylaxis    Sulfa antibiotics Hives 02/01/2014    Family History  Problem Relation Age of Onset   Hypertension Mother    Diabetes Father    Hyperlipidemia Father    Hyperlipidemia Sister    Breast cancer Sister 71   Hyperlipidemia Brother    Hypertension  Brother    Esophageal cancer Brother 3 - 7   Colon cancer Maternal Aunt 79 - 69   Liver cancer Maternal Uncle    Stomach cancer Maternal Uncle    Leukemia Paternal Uncle    Skin cancer Paternal Grandmother    Cancer Paternal Grandmother        vulvar cancer   Pancreatic cancer Neg Hx    Rectal cancer Neg Hx     Social History   Socioeconomic History   Marital status: Married    Spouse name: Not on file   Number of children: Not on file   Years of education: Not on file   Highest education level: Not on file  Occupational History   Not on file  Tobacco Use   Smoking status: Some Days    Types: Cigarettes    Last attempt to quit: 10/01/2005    Years since quitting: 17.5   Smokeless tobacco: Never  Substance and Sexual Activity   Alcohol use: Yes     Comment: occasional   Drug use: No   Sexual activity: Not on file  Other Topics Concern   Not on file  Social History Narrative   Not on file   Social Determinants of Health   Financial Resource Strain: Not on file  Food Insecurity: No Food Insecurity (03/06/2023)   Hunger Vital Sign    Worried About Running Out of Food in the Last Year: Never true    Ran Out of Food in the Last Year: Never true  Transportation Needs: No Transportation Needs (03/06/2023)   PRAPARE - Administrator, Civil Service (Medical): No    Lack of Transportation (Non-Medical): No  Physical Activity: Not on file  Stress: Not on file  Social Connections: Not on file  Intimate Partner Violence: Not At Risk (03/06/2023)   Humiliation, Afraid, Rape, and Kick questionnaire    Fear of Current or Ex-Partner: No    Emotionally Abused: No    Physically Abused: No    Sexually Abused: No    Review of Systems:  All systems reviewed were negative except where noted in HPI.   Physical Exam:  General:  Alert, well-developed, in NAD Head:  Normocephalic and atraumatic. Eyes:  Sclera clear, no icterus.   Conjunctiva pink. Ears:  Normal auditory acuity. Mouth:  No deformity or lesions.  Neck:  Supple; no masses. Lungs:  Clear throughout to auscultation.   No wheezes, crackles, or rhonchi.  Heart:  Regular rate and rhythm; no murmurs. Abdomen:  Soft, nondistended, nontender. No masses, hepatomegaly. No palpable masses.  Normal bowel sounds.    Rectal:  Deferred   Msk:  Symmetrical without gross deformities. Extremities:  Without edema. Neurologic:  Alert and  oriented x 4; grossly normal neurologically. Skin:  Intact without significant lesions or rashes. Psych:  Alert and cooperative. Normal mood and affect.    Venita Lick. Russella Dar  04/09/2023, 1:03 PM See Loretha Stapler, [facility], to contact our on call provider

## 2023-04-09 NOTE — Patient Instructions (Signed)
Discharge instructions given. Handouts on polyps and Hemorrhoids. Resume previous medications. YOU HAD AN ENDOSCOPIC PROCEDURE TODAY AT THE [facility]:   Refer to the procedure report that was given to you for any specific questions about what was found during the examination.  If the procedure report does not answer your questions, please call your gastroenterologist to clarify.  If you requested that your care partner not be given the details of your procedure findings, then the procedure report has been included in a sealed envelope for you to review at your convenience later.  YOU SHOULD EXPECT: Some feelings of bloating in the abdomen. Passage of more gas than usual.  Walking can help get rid of the air that was put into your GI tract during the procedure and reduce the bloating. If you had a lower endoscopy (such as a colonoscopy or flexible sigmoidoscopy) you may notice spotting of blood in your stool or on the toilet paper. If you underwent a bowel prep for your procedure, you may not have a normal bowel movement for a few days.  Please Note:  You might notice some irritation and congestion in your nose or some drainage.  This is from the oxygen used during your procedure.  There is no need for concern and it should clear up in a day or so.  SYMPTOMS TO REPORT IMMEDIATELY:  Following lower endoscopy (colonoscopy or flexible sigmoidoscopy):  Excessive amounts of blood in the stool  Significant tenderness or worsening of abdominal pains  Swelling of the abdomen that is new, acute  Fever of 100F or higher  For urgent or emergent issues, a gastroenterologist can be reached at any hour by calling [phone]. Do not use MyChart messaging for urgent concerns.    DIET:  We do recommend a small meal at first, but then you may proceed to your regular diet.  Drink plenty of fluids but you should avoid alcoholic beverages for 24 hours.  ACTIVITY:  You should plan to take it easy  for the rest of today and you should NOT DRIVE or use heavy machinery until tomorrow (because of the sedation medicines used during the test).    FOLLOW UP: Our staff will call the number listed on your records the next business day following your procedure.  We will call around 7:15- 8:00 am to check on you and address any questions or concerns that you may have regarding the information given to you following your procedure. If we do not reach you, we will leave a message.     If any biopsies were taken you will be contacted by phone or by letter within the next 1-3 weeks.  Please call us at [phone] if you have not heard about the biopsies in 3 weeks.    SIGNATURES/CONFIDENTIALITY: You and/or your care partner have signed paperwork which will be entered into your electronic medical record.  These signatures attest to the fact that that the information above on your After Visit Summary has been reviewed and is understood.  Full responsibility of the confidentiality of this discharge information lies with you and/or your care-partner. 

## 2023-04-09 NOTE — Op Note (Signed)
[facility] Patient Name: Abigail Foley Procedure Date: 04/09/2023 1:29 PM MRN: [id number] Endoscopist: Meryl Dare , MD, [phone] Age: 62 Referring MD:  Date of Birth: [birth year] Gender: Female Account #: [id number] Procedure:                Colonoscopy Indications:              Surveillance: Personal history of adenomatous                            polyps on last colonoscopy 5 years ago Medicines:                Monitored Anesthesia Care Procedure:                Pre-Anesthesia Assessment:                           - Prior to the procedure, a History and Physical                            was performed, and patient medications and                            allergies were reviewed. The patient's tolerance of                            previous anesthesia was also reviewed. The risks                            and benefits of the procedure and the sedation                            options and risks were discussed with the patient.                            All questions were answered, and informed consent                            was obtained. Prior Anticoagulants: The patient has                            taken no anticoagulant or antiplatelet agents. ASA                            Grade Assessment: II - A patient with mild systemic                            disease. After reviewing the risks and benefits,                            the patient was deemed in satisfactory condition to                            undergo the procedure.  After obtaining informed consent, the colonoscope                            was passed under direct vision. Throughout the                            procedure, the patient's blood pressure, pulse, and                            oxygen saturations were monitored continuously. The                            [id number]-[id number] Colonoscope [id number] was introduced                            through the anus and  advanced to the the cecum,                            identified by appendiceal orifice and ileocecal                            valve. The ileocecal valve, appendiceal orifice,                            and rectum were photographed. The quality of the                            bowel preparation was excellent. The colonoscopy                            was performed without difficulty. The patient                            tolerated the procedure well. Scope In: 1:38:08 PM Scope Out: 1:57:43 PM Scope Withdrawal Time: 0 hours 16 minutes 7 seconds  Total Procedure Duration: 0 hours 19 minutes 35 seconds  Findings:                 The perianal and digital rectal examinations were                            normal.                           Two sessile polyps were found in the transverse                            colon. The polyps were 3 to 4 mm in size. These                            polyps were removed with a cold biopsy forceps.                            Resection and retrieval were complete.  Two sessile polyps were found in the sigmoid colon.                            The polyps were 5 to 6 mm in size. These polyps                            were removed with a cold snare. Resection and                            retrieval were complete.                           External hemorrhoids were found during                            retroflexion. The hemorrhoids were small.                           The exam was otherwise without abnormality on                            direct and retroflexion views. Complications:            No immediate complications. Estimated blood loss:                            None. Estimated Blood Loss:     Estimated blood loss: none. Impression:               - Two 3 to 4 mm polyps in the transverse colon,                            removed with a cold biopsy forceps. Resected and                            retrieved.                            - Two 5 to 6 mm polyps in the sigmoid colon,                            removed with a cold snare. Resected and retrieved.                           - External hemorrhoids.                           - The examination was otherwise normal on direct                            and retroflexion views. Recommendation:           - Repeat colonoscopy after studies are complete for                            surveillance based on  pathology results.                           - Patient has a contact number available for                            emergencies. The signs and symptoms of potential                            delayed complications were discussed with the                            patient. Return to normal activities tomorrow.                            Written discharge instructions were provided to the                            patient.                           - Resume previous diet.                           - Continue present medications.                           - Await pathology results. Meryl Dare, MD 04/09/2023 2:01:29 PM This report has been signed electronically.

## 2023-04-10 ENCOUNTER — Telehealth: Payer: Self-pay

## 2023-04-10 NOTE — Telephone Encounter (Signed)
  Follow up Call-     04/09/2023   12:59 PM  Call back number  Post procedure Call Back phone  # [phone]  Permission to leave phone message Yes     Patient questions:  Do you have a fever, pain , or abdominal swelling? No. Pain Score  0 *  Have you tolerated food without any problems? Yes.    Have you been able to return to your normal activities? Yes.    Do you have any questions about your discharge instructions: Diet   No. Medications  No. Follow up visit  No.  Do you have questions or concerns about your Care? No.  Actions: * If pain score is 4 or above: No action needed, pain <4.

## 2023-04-12 ENCOUNTER — Ambulatory Visit (HOSPITAL_BASED_OUTPATIENT_CLINIC_OR_DEPARTMENT_OTHER)
Admission: RE | Admit: 2023-04-12 | Discharge: 2023-04-12 | Disposition: A | Discharge status: Home / Self Care | Payer: 59 | Attending: Surgery | Admitting: Surgery

## 2023-04-12 ENCOUNTER — Other Ambulatory Visit: Payer: Self-pay

## 2023-04-12 ENCOUNTER — Encounter (HOSPITAL_BASED_OUTPATIENT_CLINIC_OR_DEPARTMENT_OTHER)
Admission: RE | Disposition: A | Discharge status: Home / Self Care | Payer: Self-pay | Source: Home / Self Care | Attending: Surgery

## 2023-04-12 ENCOUNTER — Ambulatory Visit (HOSPITAL_BASED_OUTPATIENT_CLINIC_OR_DEPARTMENT_OTHER): Payer: 59 | Admitting: Certified Registered"

## 2023-04-12 ENCOUNTER — Other Ambulatory Visit (HOSPITAL_COMMUNITY): Payer: Self-pay

## 2023-04-12 ENCOUNTER — Encounter (HOSPITAL_BASED_OUTPATIENT_CLINIC_OR_DEPARTMENT_OTHER): Payer: Self-pay | Admitting: Surgery

## 2023-04-12 DIAGNOSIS — Z87891 Personal history of nicotine dependence: Secondary | ICD-10-CM | POA: Diagnosis not present

## 2023-04-12 DIAGNOSIS — C50511 Malignant neoplasm of lower-outer quadrant of right female breast: Secondary | ICD-10-CM

## 2023-04-12 DIAGNOSIS — Z6824 Body mass index (BMI) 24.0-24.9, adult: Secondary | ICD-10-CM | POA: Insufficient documentation

## 2023-04-12 DIAGNOSIS — E663 Overweight: Secondary | ICD-10-CM | POA: Insufficient documentation

## 2023-04-12 DIAGNOSIS — G8918 Other acute postprocedural pain: Secondary | ICD-10-CM | POA: Diagnosis not present

## 2023-04-12 DIAGNOSIS — Z01818 Encounter for other preprocedural examination: Secondary | ICD-10-CM

## 2023-04-12 DIAGNOSIS — Z803 Family history of malignant neoplasm of breast: Secondary | ICD-10-CM | POA: Insufficient documentation

## 2023-04-12 DIAGNOSIS — C50911 Malignant neoplasm of unspecified site of right female breast: Secondary | ICD-10-CM | POA: Insufficient documentation

## 2023-04-12 DIAGNOSIS — C50811 Malignant neoplasm of overlapping sites of right female breast: Secondary | ICD-10-CM | POA: Diagnosis not present

## 2023-04-12 DIAGNOSIS — Z79899 Other long term (current) drug therapy: Secondary | ICD-10-CM | POA: Insufficient documentation

## 2023-04-12 DIAGNOSIS — Z170 Estrogen receptor positive status [ER+]: Secondary | ICD-10-CM | POA: Insufficient documentation

## 2023-04-12 DIAGNOSIS — E785 Hyperlipidemia, unspecified: Secondary | ICD-10-CM | POA: Insufficient documentation

## 2023-04-12 HISTORY — PX: BREAST LUMPECTOMY WITH SENTINEL LYMPH NODE BIOPSY: SHX5597

## 2023-04-12 SURGERY — BREAST LUMPECTOMY WITH SENTINEL LYMPH NODE BX
Anesthesia: General | Site: Breast | Laterality: Right

## 2023-04-12 MED ORDER — OXYCODONE HCL 5 MG PO TABS
5.0000 mg | ORAL_TABLET | Freq: Once | ORAL | Status: AC | PRN
  Administered 2023-04-12: 5 mg via ORAL

## 2023-04-12 MED ORDER — PROMETHAZINE HCL 25 MG/ML IJ SOLN: 6.2500 mg | INTRAMUSCULAR | Status: DC | PRN

## 2023-04-12 MED ORDER — PROPOFOL 10 MG/ML IV BOLUS
INTRAVENOUS | Status: AC
  Filled 2023-04-12: qty 20

## 2023-04-12 MED ORDER — DEXAMETHASONE SODIUM PHOSPHATE 10 MG/ML IJ SOLN
INTRAMUSCULAR | Status: AC
  Filled 2023-04-12: qty 1

## 2023-04-12 MED ORDER — LIDOCAINE 2% (20 MG/ML) 5 ML SYRINGE
INTRAMUSCULAR | Status: AC
  Filled 2023-04-12: qty 5

## 2023-04-12 MED ORDER — DEXAMETHASONE SODIUM PHOSPHATE 10 MG/ML IJ SOLN
INTRAMUSCULAR | Status: DC | PRN
  Administered 2023-04-12: 10 mg via INTRAVENOUS

## 2023-04-12 MED ORDER — CHLORHEXIDINE GLUCONATE CLOTH 2 % EX PADS: 6.0000 | MEDICATED_PAD | Freq: Once | CUTANEOUS | Status: DC

## 2023-04-12 MED ORDER — FENTANYL CITRATE (PF) 100 MCG/2ML IJ SOLN
INTRAMUSCULAR | Status: AC
  Filled 2023-04-12: qty 2

## 2023-04-12 MED ORDER — LIDOCAINE HCL (CARDIAC) PF 100 MG/5ML IV SOSY
PREFILLED_SYRINGE | INTRAVENOUS | Status: DC | PRN
  Administered 2023-04-12: 40 mg via INTRAVENOUS

## 2023-04-12 MED ORDER — ACETAMINOPHEN 500 MG PO TABS
1000.0000 mg | ORAL_TABLET | Freq: Once | ORAL | Status: AC
  Administered 2023-04-12: 1000 mg via ORAL

## 2023-04-12 MED ORDER — MIDAZOLAM HCL 2 MG/2ML IJ SOLN
2.0000 mg | Freq: Once | INTRAMUSCULAR | Status: AC
  Administered 2023-04-12: 2 mg via INTRAVENOUS

## 2023-04-12 MED ORDER — TRAMADOL HCL 50 MG PO TABS
50.0000 mg | ORAL_TABLET | Freq: Four times a day (QID) | ORAL | 0 refills | Status: DC | PRN
Start: 2023-04-12 — End: 2023-05-15
  Filled 2023-04-12: qty 25, 7d supply, fill #0

## 2023-04-12 MED ORDER — EPHEDRINE SULFATE (PRESSORS) 50 MG/ML IJ SOLN
INTRAMUSCULAR | Status: DC | PRN
  Administered 2023-04-12: 5 mg via INTRAVENOUS
  Administered 2023-04-12 (×2): 10 mg via INTRAVENOUS

## 2023-04-12 MED ORDER — ONDANSETRON HCL 4 MG/2ML IJ SOLN
INTRAMUSCULAR | Status: AC
  Filled 2023-04-12: qty 2

## 2023-04-12 MED ORDER — OXYCODONE HCL 5 MG PO TABS
ORAL_TABLET | ORAL | Status: AC
  Filled 2023-04-12: qty 1

## 2023-04-12 MED ORDER — MIDAZOLAM HCL 2 MG/2ML IJ SOLN: 0.5000 mg | Freq: Once | INTRAMUSCULAR | Status: DC | PRN

## 2023-04-12 MED ORDER — LACTATED RINGERS IV SOLN: INTRAVENOUS | Status: DC

## 2023-04-12 MED ORDER — BUPIVACAINE-EPINEPHRINE 0.5% -1:200000 IJ SOLN
INTRAMUSCULAR | Status: DC | PRN
  Administered 2023-04-12: 15 mL

## 2023-04-12 MED ORDER — MEPERIDINE HCL 25 MG/ML IJ SOLN: 6.2500 mg | INTRAMUSCULAR | Status: DC | PRN

## 2023-04-12 MED ORDER — MIDAZOLAM HCL 2 MG/2ML IJ SOLN
INTRAMUSCULAR | Status: AC
  Filled 2023-04-12: qty 2

## 2023-04-12 MED ORDER — ENSURE PRE-SURGERY PO LIQD: 296.0000 mL | Freq: Once | ORAL | Status: DC

## 2023-04-12 MED ORDER — EPHEDRINE 5 MG/ML INJ
INTRAVENOUS | Status: AC
  Filled 2023-04-12: qty 5

## 2023-04-12 MED ORDER — PROPOFOL 10 MG/ML IV BOLUS
INTRAVENOUS | Status: DC | PRN
Start: 2023-04-12 — End: 2023-04-12
  Administered 2023-04-12: 150 mg via INTRAVENOUS

## 2023-04-12 MED ORDER — CIPROFLOXACIN IN D5W 400 MG/200ML IV SOLN
400.0000 mg | INTRAVENOUS | Status: AC
  Administered 2023-04-12: 400 mg via INTRAVENOUS

## 2023-04-12 MED ORDER — ACETAMINOPHEN 500 MG PO TABS: 1000.0000 mg | ORAL_TABLET | ORAL | Status: AC

## 2023-04-12 MED ORDER — CIPROFLOXACIN IN D5W 400 MG/200ML IV SOLN
INTRAVENOUS | Status: AC
  Filled 2023-04-12: qty 200

## 2023-04-12 MED ORDER — HYDROMORPHONE HCL 1 MG/ML IJ SOLN: 0.2500 mg | INTRAMUSCULAR | Status: DC | PRN

## 2023-04-12 MED ORDER — FENTANYL CITRATE (PF) 100 MCG/2ML IJ SOLN
100.0000 ug | Freq: Once | INTRAMUSCULAR | Status: AC
  Administered 2023-04-12: 100 ug via INTRAVENOUS

## 2023-04-12 MED ORDER — MAGTRACE LYMPHATIC TRACER
INTRAMUSCULAR | Status: DC | PRN
  Administered 2023-04-12: 2 mL via INTRAMUSCULAR

## 2023-04-12 MED ORDER — ONDANSETRON HCL 4 MG/2ML IJ SOLN
INTRAMUSCULAR | Status: DC | PRN
  Administered 2023-04-12: 4 mg via INTRAVENOUS

## 2023-04-12 MED ORDER — ACETAMINOPHEN 500 MG PO TABS
ORAL_TABLET | ORAL | Status: AC
  Filled 2023-04-12: qty 2

## 2023-04-12 MED ORDER — FENTANYL CITRATE (PF) 100 MCG/2ML IJ SOLN
INTRAMUSCULAR | Status: DC | PRN
  Administered 2023-04-12: 25 ug via INTRAVENOUS

## 2023-04-12 MED ORDER — OXYCODONE HCL 5 MG/5ML PO SOLN: 5.0000 mg | Freq: Once | ORAL | Status: AC | PRN

## 2023-04-12 MED ORDER — BUPIVACAINE-EPINEPHRINE (PF) 0.5% -1:200000 IJ SOLN
INTRAMUSCULAR | Status: AC
  Filled 2023-04-12: qty 30

## 2023-04-12 MED ORDER — BUPIVACAINE-EPINEPHRINE (PF) 0.5% -1:200000 IJ SOLN
INTRAMUSCULAR | Status: DC | PRN
  Administered 2023-04-12: 30 mL

## 2023-04-12 SURGICAL SUPPLY — 52 items
ADH SKN CLS APL DERMABOND .7 (GAUZE/BANDAGES/DRESSINGS) ×1
APL PRP STRL LF DISP 70% ISPRP (MISCELLANEOUS) ×1
APPLIER CLIP 9.375 MED OPEN (MISCELLANEOUS) ×1
APR CLP MED 9.3 20 MLT OPN (MISCELLANEOUS) ×1
BLADE SURG 15 STRL LF DISP TIS (BLADE) ×2 IMPLANT
BLADE SURG 15 STRL SS (BLADE) ×1
CANISTER SUCT 1200ML W/VALVE (MISCELLANEOUS) IMPLANT
CHLORAPREP W/TINT 26 (MISCELLANEOUS) ×1 IMPLANT
CLIP APPLIE 9.375 MED OPEN (MISCELLANEOUS) IMPLANT
COVER BACK TABLE 60X90IN (DRAPES) ×2 IMPLANT
COVER MAYO STAND STRL (DRAPES) ×1 IMPLANT
COVER PROBE CYLINDRICAL 5X96 (MISCELLANEOUS) ×1 IMPLANT
DERMABOND ADVANCED .7 DNX12 (GAUZE/BANDAGES/DRESSINGS) ×1 IMPLANT
DRAIN CHANNEL 19F RND (DRAIN) IMPLANT
DRAPE LAPAROSCOPIC ABDOMINAL (DRAPES) ×1 IMPLANT
DRAPE UTILITY XL STRL (DRAPES) ×1 IMPLANT
ELECT REM PT RETURN 9FT ADLT (ELECTROSURGICAL) ×1
ELECTRODE REM PT RTRN 9FT ADLT (ELECTROSURGICAL) ×1 IMPLANT
EVACUATOR SILICONE 100CC (DRAIN) IMPLANT
GAUZE SPONGE 4X4 12PLY STRL LF (GAUZE/BANDAGES/DRESSINGS) IMPLANT
GLOVE BIO SURGEON STRL SZ 6 (GLOVE) IMPLANT
GLOVE BIOGEL PI IND STRL 6.5 (GLOVE) IMPLANT
GLOVE SURG SIGNA 7.5 PF LTX (GLOVE) ×1 IMPLANT
GLOVE SURG SYN 8.0 (GLOVE) ×2 IMPLANT
GLOVE SURG SYN 8.0 PF PI (GLOVE) IMPLANT
GOWN STRL REUS W/ TWL LRG LVL3 (GOWN DISPOSABLE) ×1 IMPLANT
GOWN STRL REUS W/ TWL XL LVL3 (GOWN DISPOSABLE) ×1 IMPLANT
GOWN STRL REUS W/TWL LRG LVL3 (GOWN DISPOSABLE) ×1
GOWN STRL REUS W/TWL XL LVL3 (GOWN DISPOSABLE) ×2
HEMOSTAT SURGICEL 2X14 (HEMOSTASIS) IMPLANT
KIT MARKER MARGIN INK (KITS) ×1 IMPLANT
NDL HYPO 25X1 1.5 SAFETY (NEEDLE) ×2 IMPLANT
NDL SAFETY ECLIP 18X1.5 (MISCELLANEOUS) ×1 IMPLANT
NEEDLE HYPO 25X1 1.5 SAFETY (NEEDLE) ×2 IMPLANT
NS IRRIG 1000ML POUR BTL (IV SOLUTION) IMPLANT
PACK BASIN DAY SURGERY FS (CUSTOM PROCEDURE TRAY) ×1 IMPLANT
PENCIL SMOKE EVACUATOR (MISCELLANEOUS) ×1 IMPLANT
PIN SAFETY STERILE (MISCELLANEOUS) IMPLANT
SLEEVE SCD COMPRESS KNEE MED (STOCKING) ×1 IMPLANT
SPONGE T-LAP 18X18 ~~LOC~~+RFID (SPONGE) IMPLANT
SPONGE T-LAP 4X18 ~~LOC~~+RFID (SPONGE) ×1 IMPLANT
SUT ETHILON 3 0 PS 1 (SUTURE) IMPLANT
SUT MNCRL AB 4-0 PS2 18 (SUTURE) ×2 IMPLANT
SUT VIC AB 3-0 SH 27 (SUTURE) ×2
SUT VIC AB 3-0 SH 27X BRD (SUTURE) ×1 IMPLANT
SYR BULB EAR ULCER 3OZ GRN STR (SYRINGE) IMPLANT
SYR CONTROL 10ML LL (SYRINGE) ×2 IMPLANT
TOWEL GREEN STERILE FF (TOWEL DISPOSABLE) ×1 IMPLANT
TRACER MAGTRACE VIAL (MISCELLANEOUS) IMPLANT
TRAY FAXITRON CT DISP (TRAY / TRAY PROCEDURE) IMPLANT
TUBE CONNECTING 20X1/4 (TUBING) ×1 IMPLANT
YANKAUER SUCT BULB TIP NO VENT (SUCTIONS) IMPLANT

## 2023-04-12 NOTE — Interval H&P Note (Signed)
History and Physical Interval Note:no change in H and P  04/12/2023 12:07 PM  Abigail Foley  has presented today for surgery, with the diagnosis of RIGHT BREAST CANCER.  The various methods of treatment have been discussed with the patient and family. After consideration of risks, benefits and other options for treatment, the patient has consented to  Procedure(s): RIGHT BREAST LUMPECTOMY WITH SENTINEL LYMPH NODE BX (Right) as a surgical intervention.  The patient's history has been reviewed, patient examined, no change in status, stable for surgery.  I have reviewed the patient's chart and labs.  Questions were answered to the patient's satisfaction.     Abigail Miyamoto

## 2023-04-12 NOTE — H&P (Signed)
REFERRING PHYSICIAN: Lucia Bitter* PROVIDER: Wayne Both, MD MRN: [id number] DOB: [birth year]  Subjective   Chief Complaint: Breast Cancer  History of Present Illness: Abigail Foley is a 62 y.o. female who is seen  as an office consultation for evaluation of Breast Cancer  This is a 62 year old female who recently palpated a mass in her right breast. She underwent an ultrasound and mammograms showing a 1.4 cm mass at the 6 o'clock position of the right breast. Ultrasound the axilla was unremarkable. She underwent a biopsy of the mass showing an invasive lobular carcinoma which was grade 2. It was 90% ER positive, 5% PR positive, HER2 negative, and had a Ki-67 of 3%. She has a family history of breast cancer in her sister. She is otherwise healthy without complaints. She is on a weight loss medication and is lost over 20 pounds. He has no cardiopulmonary issues.  Review of Systems: A complete review of systems was obtained from the patient. I have reviewed this information and discussed as appropriate with the patient. See HPI as well for other ROS.  ROS   Medical History: Past Medical History:  Diagnosis Date  Anemia  History of cancer  Hyperlipidemia   Patient Active Problem List  Diagnosis  Allergic rhinitis  Cold sore  Glaucoma  Hyperlipidemia  Malignant neoplasm of lower-outer quadrant of right breast of female, estrogen receptor positive (CMS/HHS-HCC)  Mass of lower inner quadrant of right breast  Overweight  Varicose veins of bilateral lower extremities with other complications  Ringing in the ears  Constipation  Dizziness  Nervously anxious   Past Surgical History:  Procedure Laterality Date  breast biospy Left 02/20/2023  .breast biopsy Right 02/20/2023  .ablation N/A  Unknown date    Allergies  Allergen Reactions  Cefaclor Unknown  REACTION: anaphalaxis  Sulfa (Sulfonamide Antibiotics) Other (See Comments)   Current Outpatient  Medications on File Prior to Visit  Medication Sig Dispense Refill  atorvastatin (LIPITOR) 10 MG tablet Take 1 tablet by mouth once daily  buPROPion (WELLBUTRIN XL) 150 MG XL tablet Take 1 tablet by mouth once daily  chlorhexidine (HIBICLENS) 4 % external wash Apply 150 mLs topically once a week  cholecalciferol (VITAMIN D3) 2,000 unit capsule Take 5,000 Units by mouth once daily  loratadine (CLARITIN) 10 mg tablet Take 10 mg by mouth once daily  semaglutide (WEGOVY) 1.7 mg/0.75 mL pen injector Inject 1.7 mg subcutaneously once a week  sodium, potassium, and magnesium (SUPREP) oral solution Take 1 Bottle by mouth as directed  valACYclovir (VALTREX) 500 MG tablet Take 500 mg by mouth 2 (two) times daily  zinc gluconate 50 mg tablet Take 50 mg by mouth once daily   No current facility-administered medications on file prior to visit.   Family History  Problem Relation Age of Onset  Diabetes Mother  Hyperlipidemia (Elevated cholesterol) Mother  High blood pressure (Hypertension) Mother  Obesity Mother  Coronary Artery Disease (Blocked arteries around heart) Father  Hyperlipidemia (Elevated cholesterol) Father  Stroke Father  Breast cancer Sister  Hyperlipidemia (Elevated cholesterol) Sister  Obesity Sister  Skin cancer Sister    Social History   Tobacco Use  Smoking Status Former  Types: Cigarettes  Smokeless Tobacco Never    Social History   Socioeconomic History  Marital status: Married  Tobacco Use  Smoking status: Former  Types: Cigarettes  Smokeless tobacco: Never  Vaping Use  Vaping status: Never Used  Substance and Sexual Activity  Alcohol use: Yes  Comment:  occasionally  Drug use: Never   Objective:  There were no vitals filed for this visit.  There is no height or weight on file to calculate BMI.  Physical Exam   She appears well on exam.  At the 6 o'clock position there is a palpable almost 2 cm mass with overlying ecchymosis from the biopsy. The mass  is mobile. It is not involving the skin. The nipple areolar complex is normal. There are no other palpable masses in the breast.  There is no axillary adenopathy  Labs, Imaging and Diagnostic Testing: I reviewed her mammograms, ultrasound, and pathology results  Assessment and Plan:   Diagnoses and all orders for this visit:  Invasive lobular carcinoma of right breast in female (CMS/HHS-HCC)   We have discussed her case in our multidisciplinary breast cancer conference this morning. I discussed the diagnosis with the patient and her husband. I next discussed surgery for breast cancer. We discussed conservative breast surgery with lumpectomy versus mastectomy. She is interested in breast conservation. We next discussed proceeding with a right breast lumpectomy and sentinel lymph node biopsy. We discussed reasons to biopsy lymph nodes despite the normal ultrasound. I explained the surgical procedures in detail. We discussed the risks which includes but is not limited to bleeding, infection, the need for further surgery if margins or lymph nodes are positive, seroma formation, chronic lymphedema, cardiopulmonary issues with anesthesia, postoperative recovery, etc. She understands and wished to proceed with surgery which will be scheduled. She will be stopping her weight loss medication.

## 2023-04-12 NOTE — Anesthesia Procedure Notes (Signed)
Procedure Name: LMA Insertion Date/Time: 04/12/2023 12:43 PM  Performed by: Lauralyn Primes, CRNAPre-anesthesia Checklist: Patient identified, Emergency Drugs available, Suction available and Patient being monitored Patient Re-evaluated:Patient Re-evaluated prior to induction Oxygen Delivery Method: Circle system utilized Preoxygenation: Pre-oxygenation with 100% oxygen Induction Type: IV induction Ventilation: Mask ventilation without difficulty LMA: LMA inserted LMA Size: 4.0 Number of attempts: 1 Airway Equipment and Method: Bite block Placement Confirmation: positive ETCO2 Tube secured with: Tape Dental Injury: Teeth and Oropharynx as per pre-operative assessment

## 2023-04-12 NOTE — Anesthesia Procedure Notes (Signed)
Anesthesia Regional Block: Pectoralis block   Pre-Anesthetic Checklist: , timeout performed,  Correct Patient, Correct Site, Correct Laterality,  Correct Procedure, Correct Position, site marked,  Risks and benefits discussed,  Surgical consent,  Pre-op evaluation,  At surgeon's request and post-op pain management  Laterality: Right  Prep: chloraprep       Needles:  Injection technique: Single-shot  Needle Type: Echogenic Needle     Needle Length: 9cm  Needle Gauge: 21     Additional Needles:   Procedures:,,,, ultrasound used (permanent image in chart),,    Narrative:  Start time: 04/12/2023 12:16 PM End time: 04/12/2023 12:23 PM Injection made incrementally with aspirations every 5 mL.  Performed by: Personally  Anesthesiologist: Jairo Ben, MD  Additional Notes: Pt identified in Holding room.  Monitors applied. Working IV access confirmed. Timeout, Sterile prep.  #21ga ECHOgenic Arrow block needle between pec minor and serratus, then pec minor and major, between ribs 3,4 with US guidance.  Total 30cc 0.5% Bupivacaine 1:200k epi injected incrementally after negative test dose.  Patient asymptomatic, VSS, no heme aspirated, tolerated well.   Sandford Craze, MD

## 2023-04-12 NOTE — Anesthesia Preprocedure Evaluation (Addendum)
Anesthesia Evaluation  Patient identified by MRN, date of birth, ID band Patient awake    Reviewed: Allergy & Precautions, NPO status , Patient's Chart, lab work & pertinent test results  History of Anesthesia Complications Negative for: history of anesthetic complications  Airway Mallampati: I  TM Distance: >3 FB Neck ROM: Full    Dental  (+) Dental Advisory Given, Teeth Intact   Pulmonary Current Smoker and Patient abstained from smoking.   breath sounds clear to auscultation       Cardiovascular negative cardio ROS  Rhythm:Regular Rate:Normal     Neuro/Psych   Anxiety     negative neurological ROS     GI/Hepatic negative GI ROS, Neg liver ROS,,,  Endo/Other  negative endocrine ROS    Renal/GU negative Renal ROS     Musculoskeletal   Abdominal   Peds  Hematology negative hematology ROS (+)   Anesthesia Other Findings Breast cancer  Reproductive/Obstetrics                             Anesthesia Physical Anesthesia Plan  ASA: 2  Anesthesia Plan: General   Post-op Pain Management: Tylenol PO (pre-op)* and Regional block*   Induction: Intravenous  PONV Risk Score and Plan: 2 and Ondansetron and Dexamethasone  Airway Management Planned: LMA  Additional Equipment: None  Intra-op Plan:   Post-operative Plan:   Informed Consent: I have reviewed the patients History and Physical, chart, labs and discussed the procedure including the risks, benefits and alternatives for the proposed anesthesia with the patient or authorized representative who has indicated his/her understanding and acceptance.     Dental advisory given  Plan Discussed with: CRNA and Surgeon  Anesthesia Plan Comments: (Plan GA with pectoralis block for post op analgesia)       Anesthesia Quick Evaluation

## 2023-04-12 NOTE — Progress Notes (Signed)
Assisted Dr. Carswell Jackson with right, pectoralis, ultrasound guided block. Side rails up, monitors on throughout procedure. See vital signs in flow sheet. Tolerated Procedure well. ?

## 2023-04-12 NOTE — Discharge Instructions (Addendum)
No tylenol until 5:30 p.m.   [facility] Office Phone Number [phone]  BREAST BIOPSY/ PARTIAL MASTECTOMY: POST OP INSTRUCTIONS  Always review your discharge instruction sheet given to you by the facility where your surgery was performed.  IF YOU HAVE DISABILITY OR FAMILY LEAVE FORMS, YOU MUST BRING THEM TO THE OFFICE FOR PROCESSING.  DO NOT GIVE THEM TO YOUR DOCTOR.  A prescription for pain medication may be given to you upon discharge.  Take your pain medication as prescribed, if needed.  If narcotic pain medicine is not needed, then you may take acetaminophen (Tylenol) or ibuprofen (Advil) as needed. Take your usually prescribed medications unless otherwise directed If you need a refill on your pain medication, please contact your pharmacy.  They will contact our office to request authorization.  Prescriptions will not be filled after 5pm or on week-ends. You should eat very light the first 24 hours after surgery, such as soup, crackers, pudding, etc.  Resume your normal diet the day after surgery. Most patients will experience some swelling and bruising in the breast.  Ice packs and a good support bra will help.  Swelling and bruising can take several days to resolve.  It is common to experience some constipation if taking pain medication after surgery.  Increasing fluid intake and taking a stool softener will usually help or prevent this problem from occurring.  A mild laxative (Milk of Magnesia or Miralax) should be taken according to package directions if there are no bowel movements after 48 hours. Unless discharge instructions indicate otherwise, you may remove your bandages 24-48 hours after surgery, and you may shower at that time.  You may have steri-strips (small skin tapes) in place directly over the incision.  These strips should be left on the skin for 7-10 days.  If your surgeon used skin glue on the incision, you may shower in 24 hours.  The glue will flake off  over the next 2-3 weeks.  Any sutures or staples will be removed at the office during your follow-up visit. ACTIVITIES:  You may resume regular daily activities (gradually increasing) beginning the next day.  Wearing a good support bra or sports bra minimizes pain and swelling.  You may have sexual intercourse when it is comfortable. You may drive when you no longer are taking prescription pain medication, you can comfortably wear a seatbelt, and you can safely maneuver your car and apply brakes. RETURN TO WORK:  ______________________________________________________________________________________ Bonita Quin should see your doctor in the office for a follow-up appointment approximately two weeks after your surgery.  Your doctor's nurse will typically make your follow-up appointment when she calls you with your pathology report.  Expect your pathology report 2-3 business days after your surgery.  You may call to check if you do not hear from [place] after three days. OTHER INSTRUCTIONS: _YOU MAY REMOVE THE BINDER AND SHOWER STARTING TOMORROW AND THEN PLACE THE BINDER BACK ON ICE PACK, TYLENOL, AND IBUPROFEN ALSO FOR PAIN NO VIGOROUS ACTIVITY FOR ONE WEEK ______________________________________________________________________________________________ _____________________________________________________________________________________________________________________________________ _____________________________________________________________________________________________________________________________________ _____________________________________________________________________________________________________________________________________  WHEN TO CALL YOUR DOCTOR: Fever over 101.0 Nausea and/or vomiting. Extreme swelling or bruising. Continued bleeding from incision. Increased pain, redness, or drainage from the incision.  The clinic staff is available to answer your questions during regular business  hours.  Please don't hesitate to call and ask to speak to one of the nurses for clinical concerns.  If you have a medical emergency, go to the nearest emergency room or call 911.  A  surgeon from [facility] is always on call at the hospital.  For further questions, please visit [email or web address]    Post Anesthesia Home Care Instructions  Activity: Get plenty of rest for the remainder of the day. A responsible individual must stay with you for 24 hours following the procedure.  For the next 24 hours, DO NOT: -Drive a car -Advertising copywriter -Drink alcoholic beverages -Take any medication unless instructed by your physician -Make any legal decisions or sign important papers.  Meals: Start with liquid foods such as gelatin or soup. Progress to regular foods as tolerated. Avoid greasy, spicy, heavy foods. If nausea and/or vomiting occur, drink only clear liquids until the nausea and/or vomiting subsides. Call your physician if vomiting continues.  Special Instructions/Symptoms: Your throat may feel dry or sore from the anesthesia or the breathing tube placed in your throat during surgery. If this causes discomfort, gargle with warm salt water. The discomfort should disappear within 24 hours.  If you had a scopolamine patch placed behind your ear for the management of post- operative nausea and/or vomiting:  1. The medication in the patch is effective for 72 hours, after which it should be removed.  Wrap patch in a tissue and discard in the trash. Wash hands thoroughly with soap and water. 2. You may remove the patch earlier than 72 hours if you experience unpleasant side effects which may include dry mouth, dizziness or visual disturbances. 3. Avoid touching the patch. Wash your hands with soap and water after contact with the patch.

## 2023-04-12 NOTE — Transfer of Care (Signed)
Immediate Anesthesia Transfer of Care Note  Patient: Abigail Foley  Procedure(s) Performed: RIGHT BREAST LUMPECTOMY WITH SENTINEL LYMPH NODE BX (Right: Breast)  Patient Location: PACU  Anesthesia Type:GA combined with regional for post-op pain  Level of Consciousness: drowsy  Airway & Oxygen Therapy: Patient Spontanous Breathing and Patient connected to face mask oxygen  Post-op Assessment: Report given to RN and Post -op Vital signs reviewed and stable  Post vital signs: Reviewed and stable  Last Vitals:  Vitals Value Taken Time  BP 107/54 (69)   Temp    Pulse 87 04/12/23 1352  Resp 13 04/12/23 1352  SpO2 100 % 04/12/23 1352  Vitals shown include unfiled device data.  Last Pain:  Vitals:   04/12/23 1120  TempSrc: Oral  PainSc: 0-No pain      Patients Stated Pain Goal: 4 (04/12/23 1120)  Complications: No notable events documented.

## 2023-04-12 NOTE — Op Note (Signed)
   Abigail Foley 04/12/2023   Pre-op Diagnosis: RIGHT BREAST CANCER     Post-op Diagnosis: same  Procedure(s): RIGHT BREAST LUMPECTOMY  DEEP RIGHT AXILLARY LYMPH NODE BIOPSY INJECTION OF MAG TRACE FOR LYMPH NODE MAPPING  Surgeon(s): Abigail Miyamoto, MD Clabe Seal, MD [facility] Resident  Anesthesia: General  Staff:  Circulator: Maryan Rued, RN Scrub Person: Wilhelmenia Blase  Estimated Blood Loss: Minimal               Specimens: sent to path  Indications: This is a 62 year old female presents with a right breast mass.  She underwent an ultrasound and biopsy of the mass showing invasive ductal carcinoma.  The decision was made to proceed with a right breast lumpectomy and sentinel lymph node biopsy  Procedure: The patient was brought to the operating room and identified as correct patient.  She was placed upon the operating room table and general anesthesia was induced.  We injected MAg trace underneath the right nipple areolar complex and massaged the breast.  Her right breast and axilla were then prepped and draped in usual sterile fashion.  The palpable mass was located at 6:00 of the right breast 4 cm from the nipple.  We elected to make an elliptical incision over the top of the palpable mass with a scalpel after anesthetized skin with Marcaine.  We then dissected down to the breast tissue with electrocautery.  We then dissected widely around the palpable mass with electrocautery dissecting both inferior and superiorly as well as medial and laterally going all the way down to the chest wall.  We then completed the lumpectomy removing the mass.  I marked all margins with paint.  We did perform an x-ray of the mass confirming that the biopsy clip was in the center of the specimen.  The lumpectomy was then sent to pathology for evaluation.  Using the mag trace probe we then identified an area of uptake in the right axilla.  We anesthetized skin with Marcaine and made incision  with a scalpel.  We then dissected down to the deep right axillary tissue.  We identified what appeared to be 2 sentinel lymph nodes and excised these in their entirety with the electrocautery.  The nodes were sent to pathology for evaluation.  There was no further increased uptake of the mag trace and no palpable lymph nodes in the axilla.  We next anesthetized the lumpectomy cavity further with Marcaine and achieved hemostasis with cautery.  We placed surgical clips around the periphery of the lumpectomy cavity for marking purposes.  Both incisions were then closed with interrupted 3-0 Vicryl sutures and running 4-0 Monocryl sutures.  Dermabond was then applied.  The patient tolerated the procedure well.  All counts were correct at the end of the procedure.  The patient was then extubated in the operating room and taken in stable condition to the recovery area.  She was placed in a breast binder prior to extubation.          Abigail Miyamoto   Date: 04/12/2023  Time: 1:27 PM

## 2023-04-12 NOTE — Anesthesia Postprocedure Evaluation (Signed)
Anesthesia Post Note  Patient: Abigail Foley  Procedure(s) Performed: RIGHT BREAST LUMPECTOMY WITH SENTINEL LYMPH NODE BX (Right: Breast)     Patient location during evaluation: Phase II Anesthesia Type: General Level of consciousness: awake and alert, patient cooperative and oriented Pain management: pain level controlled Vital Signs Assessment: post-procedure vital signs reviewed and stable Respiratory status: spontaneous breathing, nonlabored ventilation and respiratory function stable Cardiovascular status: blood pressure returned to baseline and stable Postop Assessment: no apparent nausea or vomiting, able to ambulate and adequate PO intake Anesthetic complications: no   No notable events documented.  Last Vitals:  Vitals:   04/12/23 1415 04/12/23 1430  BP: (!) 107/56 (!) 111/59  Pulse: 82 78  Resp: 15 16  Temp:    SpO2: 98% 97%    Last Pain:  Vitals:   04/12/23 1430  TempSrc:   PainSc: 3                  Jourden Gilson,E. Marlina Cataldi

## 2023-04-15 ENCOUNTER — Encounter (HOSPITAL_BASED_OUTPATIENT_CLINIC_OR_DEPARTMENT_OTHER): Payer: Self-pay | Admitting: Surgery

## 2023-04-15 LAB — SURGICAL PATHOLOGY

## 2023-04-16 ENCOUNTER — Encounter: Payer: 59 | Admitting: Internal Medicine

## 2023-04-18 ENCOUNTER — Encounter: Payer: Self-pay | Admitting: *Deleted

## 2023-04-18 ENCOUNTER — Telehealth: Payer: Self-pay | Admitting: *Deleted

## 2023-04-18 NOTE — Telephone Encounter (Signed)
Received order for oncotype testing. Requisition sent to pathology 

## 2023-04-22 ENCOUNTER — Encounter: Payer: Self-pay | Admitting: Gastroenterology

## 2023-04-23 ENCOUNTER — Encounter: Payer: Self-pay | Admitting: Internal Medicine

## 2023-04-23 ENCOUNTER — Ambulatory Visit (INDEPENDENT_AMBULATORY_CARE_PROVIDER_SITE_OTHER): Payer: 59 | Admitting: Internal Medicine

## 2023-04-23 ENCOUNTER — Other Ambulatory Visit (HOSPITAL_COMMUNITY): Payer: Self-pay

## 2023-04-23 VITALS — BP 112/76 | HR 69 | Temp 98.3°F | Ht 65.0 in | Wt 144.0 lb

## 2023-04-23 DIAGNOSIS — E782 Mixed hyperlipidemia: Secondary | ICD-10-CM | POA: Diagnosis not present

## 2023-04-23 DIAGNOSIS — Z0000 Encounter for general adult medical examination without abnormal findings: Secondary | ICD-10-CM

## 2023-04-23 LAB — LIPID PANEL
Cholesterol: 150 mg/dL (ref 0–200)
HDL: 48.9 mg/dL (ref >39.00)
LDL Cholesterol: 78 mg/dL (ref 0–99)
NonHDL: 100.99
Total CHOL/HDL Ratio: 3
Triglycerides: 116 mg/dL (ref 0.0–149.0)
VLDL: 23.2 mg/dL (ref 0.0–40.0)

## 2023-04-23 LAB — HEMOGLOBIN A1C: Hgb A1c MFr Bld: 5.3 % (ref 4.6–6.5)

## 2023-04-23 MED ORDER — BUPROPION HCL ER (XL) 150 MG PO TB24
150.0000 mg | ORAL_TABLET | Freq: Every day | ORAL | 3 refills | Status: DC
  Filled 2023-04-23 – 2023-05-22 (×2): qty 90, 90d supply, fill #0
  Filled 2023-08-13: qty 90, 90d supply, fill #1
  Filled 2023-11-04: qty 90, 90d supply, fill #2
  Filled 2024-02-21: qty 90, 90d supply, fill #3

## 2023-04-23 MED ORDER — ATORVASTATIN CALCIUM 10 MG PO TABS
10.0000 mg | ORAL_TABLET | Freq: Every day | ORAL | 3 refills | Status: DC
  Filled 2023-04-23 – 2023-05-22 (×2): qty 90, 90d supply, fill #0
  Filled 2024-02-21: qty 90, 90d supply, fill #1

## 2023-04-23 MED ORDER — VALACYCLOVIR HCL 500 MG PO TABS
500.0000 mg | ORAL_TABLET | Freq: Two times a day (BID) | ORAL | 3 refills | Status: AC
Start: 2023-04-23 — End: ?
  Filled 2023-04-23: qty 90, 45d supply, fill #0
  Filled 2023-08-13: qty 90, 45d supply, fill #1

## 2023-04-23 MED ORDER — CHLORHEXIDINE GLUCONATE 4 % EX SOLN
Freq: Every day | CUTANEOUS | 3 refills | Status: AC | PRN
  Filled 2023-04-23: qty 473, fill #0

## 2023-04-23 NOTE — Progress Notes (Signed)
   Subjective:   Patient ID: Abigail Foley, female    DOB: 18-Apr-1961, 62 y.o.   MRN: [id number]  HPI The patient is here for physical. New diagnosis breast cancer since last visit pending oncology visit s/p for plan.  PMH, [facility], social history reviewed and updated  Review of Systems  Constitutional: Negative.   HENT: Negative.    Eyes: Negative.   Respiratory:  Negative for cough, chest tightness and shortness of breath.   Cardiovascular:  Negative for chest pain, palpitations and leg swelling.  Gastrointestinal:  Negative for abdominal distention, abdominal pain, constipation, diarrhea, nausea and vomiting.  Musculoskeletal: Negative.   Skin: Negative.   Neurological: Negative.   Psychiatric/Behavioral: Negative.      Objective:  Physical Exam Constitutional:      Appearance: She is well-developed.  HENT:     Head: Normocephalic and atraumatic.  Cardiovascular:     Rate and Rhythm: Normal rate and regular rhythm.  Pulmonary:     Effort: Pulmonary effort is normal. No respiratory distress.     Breath sounds: Normal breath sounds. No wheezing or rales.  Abdominal:     General: Bowel sounds are normal. There is no distension.     Palpations: Abdomen is soft.     Tenderness: There is no abdominal tenderness. There is no rebound.  Musculoskeletal:     Cervical back: Normal range of motion.  Skin:    General: Skin is warm and dry.  Neurological:     Mental Status: She is alert and oriented to person, place, and time.     Coordination: Coordination normal.     Vitals:   04/23/23 0845  BP: 112/76  Pulse: 69  Temp: 98.3 F (36.8 C)  TempSrc: Oral  SpO2: 99%  Weight: 144 lb (65.3 kg)  Height: 5\' 5"  (1.651 m)    Assessment & Plan:

## 2023-04-25 DIAGNOSIS — C50511 Malignant neoplasm of lower-outer quadrant of right female breast: Secondary | ICD-10-CM | POA: Diagnosis not present

## 2023-04-25 DIAGNOSIS — Z170 Estrogen receptor positive status [ER+]: Secondary | ICD-10-CM | POA: Diagnosis not present

## 2023-04-26 ENCOUNTER — Encounter (HOSPITAL_COMMUNITY): Payer: Self-pay

## 2023-04-26 ENCOUNTER — Telehealth: Payer: Self-pay | Admitting: *Deleted

## 2023-04-26 ENCOUNTER — Other Ambulatory Visit: Payer: Self-pay

## 2023-04-26 ENCOUNTER — Inpatient Hospital Stay: Payer: 59 | Attending: Hematology and Oncology | Admitting: Hematology and Oncology

## 2023-04-26 ENCOUNTER — Encounter: Payer: Self-pay | Admitting: *Deleted

## 2023-04-26 VITALS — BP 96/78 | HR 79 | Temp 97.5°F | Resp 18 | Ht 65.0 in | Wt 145.4 lb

## 2023-04-26 DIAGNOSIS — Z806 Family history of leukemia: Secondary | ICD-10-CM | POA: Diagnosis not present

## 2023-04-26 DIAGNOSIS — Z720 Tobacco use: Secondary | ICD-10-CM | POA: Insufficient documentation

## 2023-04-26 DIAGNOSIS — Z800 Family history of malignant neoplasm of digestive organs: Secondary | ICD-10-CM | POA: Diagnosis not present

## 2023-04-26 DIAGNOSIS — C50511 Malignant neoplasm of lower-outer quadrant of right female breast: Secondary | ICD-10-CM | POA: Diagnosis not present

## 2023-04-26 DIAGNOSIS — Z170 Estrogen receptor positive status [ER+]: Secondary | ICD-10-CM | POA: Insufficient documentation

## 2023-04-26 DIAGNOSIS — Z803 Family history of malignant neoplasm of breast: Secondary | ICD-10-CM | POA: Insufficient documentation

## 2023-04-26 DIAGNOSIS — Z808 Family history of malignant neoplasm of other organs or systems: Secondary | ICD-10-CM | POA: Diagnosis not present

## 2023-04-26 DIAGNOSIS — Z8049 Family history of malignant neoplasm of other genital organs: Secondary | ICD-10-CM | POA: Diagnosis not present

## 2023-04-26 NOTE — Assessment & Plan Note (Signed)
Checking lipid panel and adjust lipitor 10 mg daily as needed.

## 2023-04-26 NOTE — Assessment & Plan Note (Signed)
This is a very pleasant 62 year old postmenopausal female patient with newly diagnosed right breast grade 2 ILC, ER 90% positive strong staining PR 5% positive strong staining Ki-67 of 3% HER2 1+ refer to breast M[facility] for additional recommendations.  She is healthy at baseline.  Given small tumor, strong ER positivity she will proceed with upfront surgery followed by Oncotype DX testing.  She is now status post lumpectomy with final pathology showing grade 1 invasive lobular carcinoma, posterior margin positive.  1 sentinel lymph node negative.  No additional surgery planned since the posterior margin is the pectoralis major muscle.  Oncotype DX of 10 hence there is no role for adjuvant chemotherapy.  She will proceed with adjuvant radiation followed by antiestrogen therapy.  Previously we have discussed about both tamoxifen versus aromatase inhibitors.  At this time she is leaning towards aromatase inhibitors.  I have encouraged her to return to clinic in approximately 8 weeks.  We have also discussed about considering antiestrogen therapy beyond 5 years given the lobular histology but we will certainly readdress this at the 5-year mark.  She is agreeable to all the recommendations.  In basket message sent to the radiation oncology team about her follow-up appointment.  Thank you for consulting [place] in the care of this patient.  Please do not hesitate to contact us with any additional questions or concerns.

## 2023-04-26 NOTE — Telephone Encounter (Signed)
Received oncotype score of 10. Physician team aware. Referral placed for pt to see Dr. Mitzi Hansen to discuss xrt.

## 2023-04-26 NOTE — Progress Notes (Signed)
[facility] CONSULT NOTE  Patient Care Team: Myrlene Broker, MD as PCP - General (Internal Medicine) Rachel Moulds, MD as Consulting Physician (Hematology and Oncology) Abigail Miyamoto, MD as Consulting Physician (General Surgery) Dorothy Puffer, MD as Consulting Physician (Radiation Oncology) Pershing Proud, RN as Oncology Nurse Navigator Donnelly Angelica, RN as Oncology Nurse Navigator  CHIEF COMPLAINTS/PURPOSE OF CONSULTATION:  Newly diagnosed breast cancer  HISTORY OF PRESENTING ILLNESS:  Abigail Foley 62 y.o. female is here because of recent diagnosis of right breast cancer  I reviewed her records extensively and collaborated the history with the patient.  SUMMARY OF ONCOLOGIC HISTORY: Oncology History  Malignant neoplasm of lower-outer quadrant of right breast of female, estrogen receptor positive (HCC)  02/12/2023 Mammogram   Diagnostic mammogram showed suspicious right breast mass corresponding with patient's palpable lump, recommend ultrasound-guided biopsy.  Indeterminate left breast calcs, recommend stereotactic biopsy.  No suspicious right axillary lymphadenopathy.   02/20/2023 Pathology Results   Right breast needle core biopsy showed overall grade 2 invasive mammary carcinoma, consistent with lobular phenotype, left breast needle core biopsy in the upper outer breast showed Fibrocystic changes with calcs, negative for carcinoma.  Prognostics from the invasive lobular carcinoma showed ER 90% positive strong staining PR 5% positive strong staining Ki-67 of 3% HER2 1+   03/05/2023 Initial Diagnosis   Malignant neoplasm of lower-outer quadrant of right breast of female, estrogen receptor positive (HCC)   03/06/2023 Cancer Staging   Staging form: Breast, AJCC 8th Edition - Clinical stage from 03/06/2023: Stage IA (cT1c, cN0, cM0, G2, ER+, PR+, HER2-) - Signed by Ronny Bacon, PA-C on 03/06/2023 Stage prefix: Initial diagnosis Histologic grading  system: 3 grade system    Genetic Testing   [facility] Common Cancer Panel+RNA was Negative. Report date is 03/12/2023.  The Common Hereditary Cancers Panel offered by [facility] includes sequencing and/or deletion duplication testing of the following 48 genes: APC, ATM, AXIN2, BAP1, BARD1, BMPR1A, BRCA1, BRCA2, BRIP1, CDH1, CDK4, CDKN2A (p14ARF and p16INK4a only), CHEK2, CTNNA1, DICER1, EPCAM (Deletion/duplication testing only), FH, GREM1 (promoter region duplication testing only), HOXB13, KIT, MBD4, MEN1, MLH1, MSH2, MSH3, MSH6, MUTYH, NF1, NHTL1, PALB2, PDGFRA, PMS2, POLD1, POLE, PTEN, RAD51C, RAD51D, SDHA (sequencing analysis only except exon 14), SDHB, SDHC, SDHD, SMAD4, SMARCA4. STK11, TP53, TSC1, TSC2, and VHL.    Since her last visit she had right breast lumpectomy and this showed grade 2 1.4 cm measuring invasive lobular carcinoma, posterior margin focally positive for invasive carcinoma however no further surgery planned since posterior margin is the pectoralis major muscle.  Negative for lymphovascular or perineural invasion right axillary sentinel lymph node was negative for carcinoma.  Prior prognostic showed ER 90% positive strong staining PR 5% positive strong staining, HER2 negative 1+ She is recovering very well from surgery.  She is yet to see the radiation oncology team.  MEDICAL HISTORY:  Past Medical History:  Diagnosis Date   Actinic keratosis    Allergy    Anemia    Anxiety    Asthma    Blood transfusion without reported diagnosis    Hot flashes     SURGICAL HISTORY: Past Surgical History:  Procedure Laterality Date   ABLATION     BREAST BIOPSY Left 02/20/2023   MM LT BREAST BX W LOC DEV 1ST LESION IMAGE BX SPEC STEREO GUIDE 02/20/2023 [facility]   BREAST BIOPSY Right 02/20/2023   [place] RT BREAST BX W LOC DEV 1ST LESION IMG BX SPEC US GUIDE 02/20/2023  [facility]   BREAST LUMPECTOMY WITH SENTINEL LYMPH NODE BIOPSY Right 04/12/2023   Procedure: RIGHT BREAST  LUMPECTOMY WITH SENTINEL LYMPH NODE BX;  Surgeon: Abigail Miyamoto, MD;  Location: [facility];  Service: General;  Laterality: Right;   CESAREAN SECTION     DILATION AND CURETTAGE OF UTERUS     ECTOPIC PREGNANCY SURGERY     TUBAL LIGATION     WISDOM TOOTH EXTRACTION      SOCIAL HISTORY: Social History   Socioeconomic History   Marital status: Married    Spouse name: Not on file   Number of children: Not on file   Years of education: Not on file   Highest education level: Not on file  Occupational History   Not on file  Tobacco Use   Smoking status: Some Days    Current packs/day: 0.00    Types: Cigarettes    Last attempt to quit: 10/01/2005    Years since quitting: 17.5   Smokeless tobacco: Never  Substance and Sexual Activity   Alcohol use: Yes    Comment: occasional   Drug use: No   Sexual activity: Not on file  Other Topics Concern   Not on file  Social History Narrative   Not on file   Social Determinants of Health   Financial Resource Strain: Not on file  Food Insecurity: No Food Insecurity (03/06/2023)   Hunger Vital Sign    Worried About Running Out of Food in the Last Year: Never true    Ran Out of Food in the Last Year: Never true  Transportation Needs: No Transportation Needs (03/06/2023)   PRAPARE - Administrator, Civil Service (Medical): No    Lack of Transportation (Non-Medical): No  Physical Activity: Not on file  Stress: Not on file  Social Connections: Not on file  Intimate Partner Violence: Not At Risk (03/06/2023)   Humiliation, Afraid, Rape, and Kick questionnaire    Fear of Current or Ex-Partner: No    Emotionally Abused: No    Physically Abused: No    Sexually Abused: No    FAMILY HISTORY: Family History  Problem Relation Age of Onset   Hypertension Mother    Diabetes Father    Hyperlipidemia Father    Hyperlipidemia Sister    Breast cancer Sister 50   Hyperlipidemia Brother    Hypertension Brother     Esophageal cancer Brother 32 - 17   Colon cancer Maternal Aunt 60 - 69   Liver cancer Maternal Uncle    Stomach cancer Maternal Uncle    Leukemia Paternal Uncle    Skin cancer Paternal Grandmother    Cancer Paternal Grandmother        vulvar cancer   Pancreatic cancer Neg Hx    Rectal cancer Neg Hx     ALLERGIES:  is allergic to cefaclor and sulfa antibiotics.  MEDICATIONS:  Current Outpatient Medications  Medication Sig Dispense Refill   atorvastatin (LIPITOR) 10 MG tablet Take 1 tablet (10 mg total) by mouth daily. 90 tablet 3   buPROPion (WELLBUTRIN XL) 150 MG 24 hr tablet Take 1 tablet (150 mg total) by mouth daily. 90 tablet 3   chlorhexidine (HIBICLENS) 4 % external liquid Apply topically daily as needed. 473 mL 3   loratadine (CLARITIN) 10 MG tablet Take 10 mg by mouth daily.     Multiple Vitamin (MULTIVITAMIN ADULT PO) Take by mouth.     traMADol (ULTRAM) 50 MG tablet Take 1 tablet (50  mg total) by mouth every 6 (six) hours as needed for moderate pain or severe pain. 25 tablet 0   valACYclovir (VALTREX) 500 MG tablet Take 1 tablet (500 mg) by mouth 2 times daily. 90 tablet 3   Zinc 50 MG TABS Take by mouth daily.     No current facility-administered medications for this visit.    REVIEW OF SYSTEMS:   Constitutional: Denies fevers, chills or abnormal night sweats Eyes: Denies blurriness of vision, double vision or watery eyes Ears, nose, mouth, throat, and face: Denies mucositis or sore throat Respiratory: Denies cough, dyspnea or wheezes Cardiovascular: Denies palpitation, chest discomfort or lower extremity swelling Gastrointestinal:  Denies nausea, heartburn or change in bowel habits Skin: Denies abnormal skin rashes Lymphatics: Denies new lymphadenopathy or easy bruising Neurological:Denies numbness, tingling or new weaknesses Behavioral/Psych: Mood is stable, no new changes  Breast: Denies any palpable lumps or discharge All other systems were reviewed with the  patient and are negative.  PHYSICAL EXAMINATION: ECOG PERFORMANCE STATUS: 0 - Asymptomatic  Vitals:   04/26/23 0926  BP: 96/78  Pulse: 79  Resp: 18  Temp: (!) 97.5 F (36.4 C)  SpO2: 100%   Filed Weights   04/26/23 0926  Weight: 145 lb 6.4 oz (66 kg)    GENERAL:alert, no distress and comfortable   LABORATORY DATA:  I have reviewed the data as listed Lab Results  Component Value Date   WBC 5.4 03/06/2023   HGB 12.0 03/06/2023   HCT 36.0 03/06/2023   MCV 95.2 03/06/2023   PLT 264 03/06/2023   Lab Results  Component Value Date   NA 139 03/06/2023   K 4.0 03/06/2023   CL 106 03/06/2023   CO2 26 03/06/2023    RADIOGRAPHIC STUDIES: I have personally reviewed the radiological reports and agreed with the findings in the report.  ASSESSMENT AND PLAN:  Malignant neoplasm of lower-outer quadrant of right breast of female, estrogen receptor positive (HCC) This is a very pleasant 62 year old postmenopausal female patient with newly diagnosed right breast grade 2 ILC, ER 90% positive strong staining PR 5% positive strong staining Ki-67 of 3% HER2 1+ refer to breast MDC for additional recommendations.  She is healthy at baseline.  Given small tumor, strong ER positivity she will proceed with upfront surgery followed by Oncotype DX testing.  She is now status post lumpectomy with final pathology showing grade 1 invasive lobular carcinoma, posterior margin positive.  1 sentinel lymph node negative.  No additional surgery planned since the posterior margin is the pectoralis major muscle.  Oncotype DX of 10 hence there is no role for adjuvant chemotherapy.  She will proceed with adjuvant radiation followed by antiestrogen therapy.  Previously we have discussed about both tamoxifen versus aromatase inhibitors.  At this time she is leaning towards aromatase inhibitors.  I have encouraged her to return to clinic in approximately 8 weeks.  We have also discussed about considering antiestrogen  therapy beyond 5 years given the lobular histology but we will certainly readdress this at the 5-year mark.  She is agreeable to all the recommendations.  In basket message sent to the radiation oncology team about her follow-up appointment.  Thank you for consulting [place] in the care of this patient.  Please do not hesitate to contact us with any additional questions or concerns.   All questions were answered. The patient knows to call the clinic with any problems, questions or concerns.    Rachel Moulds, MD 04/26/23

## 2023-04-26 NOTE — Assessment & Plan Note (Signed)
Flu shot yearly. Shingrix counseled declines today. Tetanus up to date. Colonoscopy up to date. Mammogram up to date, pap smear up to date. Counseled about sun safety and mole surveillance. Counseled about the dangers of distracted driving. Given 10 year screening recommendations.

## 2023-05-03 ENCOUNTER — Encounter: Payer: Self-pay | Admitting: *Deleted

## 2023-05-07 NOTE — Therapy (Signed)
OUTPATIENT PHYSICAL THERAPY BREAST CANCER POST OP FOLLOW UP   Patient Name: Abigail Foley MRN: [id number] DOB:[birth year], 62 y.o., female Today's Date: 05/08/2023  END OF SESSION:  PT End of Session - 05/08/23 0756     Visit Number 2    Number of Visits 2    Date for PT Re-Evaluation 05/08/23    PT Start Time 0800    PT Stop Time 0846    PT Time Calculation (min) 46 min    Activity Tolerance Patient tolerated treatment well    Behavior During Therapy [facility] for tasks assessed/performed             Past Medical History:  Diagnosis Date   Actinic keratosis    Allergy    Anemia    Anxiety    Asthma    Blood transfusion without reported diagnosis    Hot flashes    Past Surgical History:  Procedure Laterality Date   ABLATION     BREAST BIOPSY Left 02/20/2023   MM LT BREAST BX W LOC DEV 1ST LESION IMAGE BX SPEC STEREO GUIDE 02/20/2023 [facility]   BREAST BIOPSY Right 02/20/2023   [place] RT BREAST BX W LOC DEV 1ST LESION IMG BX SPEC US GUIDE 02/20/2023 [facility]   BREAST LUMPECTOMY WITH SENTINEL LYMPH NODE BIOPSY Right 04/12/2023   Procedure: RIGHT BREAST LUMPECTOMY WITH SENTINEL LYMPH NODE BX;  Surgeon: Abigail Miyamoto, MD;  Location: [facility];  Service: General;  Laterality: Right;   CESAREAN SECTION     DILATION AND CURETTAGE OF UTERUS     ECTOPIC PREGNANCY SURGERY     TUBAL LIGATION     WISDOM TOOTH EXTRACTION     Patient Active Problem List   Diagnosis Date Noted   Genetic testing 03/14/2023   Malignant neoplasm of lower-outer quadrant of right breast of female, estrogen receptor positive (HCC) 03/05/2023   Hyperlipidemia 03/01/2022   Routine general medical examination at a health care facility 02/24/2021   Cold sore 02/24/2021   Glaucoma 01/06/2016   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 Abigail Miyamoto  REFERRING DIAG: Right  Breast Cancer  THERAPY DIAG:  Malignant neoplasm of lower-outer quadrant of right breast of female, estrogen receptor positive (HCC)  Abnormal posture  Rationale for Evaluation and Treatment: Rehabilitation  ONSET DATE: 02/21/2023  SUBJECTIVE:  SUBJECTIVE STATEMENT: I have been doing the exercises and think I am doing well. Nopain from incision, but some pain/sensitivity under the breast.  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 It measures 1.4 cm and is located in the lower outer quadrant. It is ER/PR positive and HER2 negative with a Ki67 of 3%. She had a Right Breast Lumpectomy on 04/12/2023 with 1 negative sentinel LN    PATIENT GOALS:  Reassess how my recovery is going related to arm function, pain, and swelling.  PAIN:  Are you having pain? No  PRECAUTIONS: Recent Surgery, right UE Lymphedema risk,   RED FLAGS: None   ACTIVITY LEVEL / LEISURE: Returned to walking, anxious to return to wt training.   OBJECTIVE:   PATIENT SURVEYS:  QUICK DASH: 2.27  OBSERVATIONS: Incisions well healed; thick glue still present at axilla and mild glue at inferior breast incision.  POSTURE:  Forward head, rounded shoulders  LYMPHEDEMA ASSESSMENT:  UPPER EXTREMITY AROM/PROM:   A/PROM RIGHT   eval   RIGHT 05/08/2023  Shoulder extension 46 47  Shoulder flexion 147 146 tight under breast  Shoulder abduction 162 174  Shoulder internal rotation 65 64  Shoulder external rotation 88 103                          (Blank rows = not tested)   A/PROM LEFT   eval  Shoulder extension 50  Shoulder flexion 147  Shoulder abduction 158  Shoulder internal rotation 67  Shoulder external rotation 88                          (Blank rows  = not tested)   CERVICAL AROM: All within normal limits:      Percent limited  Flexion WNL  Extension WNL  Right lateral flexion 25% limited  Left lateral flexion 25% limited  Right rotation WNL  Left rotation WNL      UPPER EXTREMITY STRENGTH: WNL   LYMPHEDEMA ASSESSMENTS:     LANDMARK RIGHT   eval RIGHT 05/08/2023  10 cm proximal to olecranon process 25.9 25.3  Olecranon process 23.4 23.1  10 cm proximal to ulnar styloid process 20.8 20.4  Just proximal to ulnar styloid process 14.9 15.0  Across hand at thumb web space 18.5 18.9  At base of 2nd digit 6.1 6.0  (Blank rows = not tested)   LANDMARK LEFT   eval  10 cm proximal to olecranon process 26.7  Olecranon process 22.9  10 cm proximal to ulnar styloid process 20.3  Just proximal to ulnar styloid process 14.9  Across hand at thumb web space 18.1  At base of 2nd digit 5.5  (Blank rows = not tested)   Surgery type/Date: Right Lumpectomy with SLNB on 04/12/2023 Number of lymph nodes removed: 0/1 Current/past treatment (chemo, radiation, hormone therapy): pending radiation Other symptoms:  Heaviness/tightness Yesmild tightness Pain No Pitting edema No Infections No Decreased scar mobility Yes Stemmer sign No  PATIENT EDUCATION:  Access Code: [email or web address] URL: [email or web address] Date: 05/08/2023 Prepared by: Alvira Monday  Exercises - Standing Shoulder and Trunk Flexion at Table  - 2 x daily - 7 x weekly - 1 sets - 3 reps - 20-30 hold - Single Arm Doorway Pec Stretch at 90 Degrees Abduction  - 2 x daily - 7 x weekly - 1 sets - 3 reps - 20-30 hold -  Corner Pec Major Stretch  - 2 x daily - 7 x weekly - 1 sets - 3 reps - 20-30 hold  Education details: HEP, SOZO screens, ABC class, continue exs/compression bra for duration of radiation and beyond prn,scar massage Person educated: Patient Education method: Explanation, Demonstration, and Handouts Education comprehension: verbalized understanding  and returned demonstration  HOME EXERCISE PROGRAM: Reviewed previously given post op HEP. Added standing lat stretch and pec stretch to HEP  ASSESSMENT:  CLINICAL IMPRESSION: Pt is s/p Right Lumpectomy with SLNB  and 0/1 LN's on 04/12/2023. She is progressing very well with excellent ROM, no signs of UE/breast swelling and incisions healing well with glue still present. She was set up for ABC class and SOZO screens. She was educated in scar massage, and she was shown several additional exercises. There are no further PT needs identified at this time. She was advised to contact us anytime with questions or concerns.  Pt will benefit from skilled therapeutic intervention to improve on the following deficits: Decreased knowledge of precautions, impaired UE functional use, pain, decreased ROM, postural dysfunction.   PT treatment/interventions: ADL/Self care home management, Therapeutic exercises, Patient/Family education, Self Care, scar mobilization, and Manual therapy   GOALS: Goals reviewed with patient? Yes  LONG TERM GOALS:  (STG=LTG)  GOALS Name Target Date  Goal status  1 Pt will demonstrate she has regained full shoulder ROM and function post operatively compared to baselines.  Baseline: 05/08/2023 MET     PLAN:  PT FREQUENCY/DURATION: No further visits required  PLAN FOR NEXT SESSION: Pt is discharged to independent management at this time PHYSICAL THERAPY DISCHARGE SUMMARY  Visits from Start of Care: 2  Current functional level related to goals / functional outcomes: Achieved goals   Remaining deficits: NA   Education / Equipment: HEP   Patient agrees to discharge. Patient goals were met. Patient is being discharged due to meeting the stated rehab goals.    [facility]  [address], [address]  [place] [address]  [phone]  After Breast Cancer Class It is recommended you attend the ABC class to be educated on lymphedema risk  reduction. This class is free of charge and lasts for 1 hour. It is a 1-time class. You will need to download the TEAMS app either on your phone or computer. We will send you a link the night before or the morning of the class. You should be able to click on that link to join the class. This is not a confidential class. You don't have to turn your camera on, but other participants may be able to see your email address.  Scar massage You can begin gentle scar massage to you incision sites. Gently place one hand on the incision and move the skin (without sliding on the skin) in various directions. Do this for a few minutes and then you can gently massage either coconut oil or vitamin E cream into the scars.  Compression garment You should continue wearing your compression bra until you feel like you no longer have swelling.  Home exercise Program Continue doing the exercises you were given until you feel like you can do them without feeling any tightness at the end.   Walking Program Studies show that 30 minutes of walking per day (fast enough to elevate your heart rate) can significantly reduce the risk of a cancer recurrence. If you can't walk due to other medical reasons, we encourage you to find another activity you could  do (like a stationary bike or water exercise).  Posture After breast cancer surgery, people frequently sit with rounded shoulders posture because it puts their incisions on slack and feels better. If you sit like this and scar tissue forms in that position, you can become very tight and have pain sitting or standing with good posture. Try to be aware of your posture and sit and stand up tall to heal properly.  Follow up PT: It is recommended you return every 3 months for the first 3 years following surgery to be assessed on the SOZO machine for an L-Dex score. This helps prevent clinically significant lymphedema in 95% of patients. These follow up screens are 10 minute  appointments that you are not billed for.  Waynette Buttery, PT 05/08/2023, 8:48 AM

## 2023-05-08 ENCOUNTER — Ambulatory Visit: Payer: 59 | Attending: Surgery

## 2023-05-08 DIAGNOSIS — R293 Abnormal posture: Secondary | ICD-10-CM | POA: Insufficient documentation

## 2023-05-08 DIAGNOSIS — C50511 Malignant neoplasm of lower-outer quadrant of right female breast: Secondary | ICD-10-CM | POA: Insufficient documentation

## 2023-05-08 DIAGNOSIS — Z170 Estrogen receptor positive status [ER+]: Secondary | ICD-10-CM | POA: Diagnosis not present

## 2023-05-08 NOTE — Patient Instructions (Signed)
     [facility]  [address], [address]  [place] [address]  [phone]  After Breast Cancer Class; August 19, 12:00 It is recommended you attend the ABC class to be educated on lymphedema risk reduction. This class is free of charge and lasts for 1 hour. It is a 1-time class. You will need to download the TEAMS app either on your phone or computer. We will send you a link the night before or the morning of the class. You should be able to click on that link to join the class. This is not a confidential class. You don't have to turn your camera on, but other participants may be able to see your email address.  Scar massage You can begin gentle scar massage to you incision sites. Gently place one hand on the incision and move the skin (without sliding on the skin) in various directions. Do this for a few minutes and then you can gently massage either coconut oil or vitamin E cream into the scars.  Compression garment You should continue wearing your compression bra until you feel like you no longer have swelling.  Home exercise Program Continue doing the exercises you were given until you feel like you can do them without feeling any tightness at the end.   Walking Program Studies show that 30 minutes of walking per day (fast enough to elevate your heart rate) can significantly reduce the risk of a cancer recurrence. If you can't walk due to other medical reasons, we encourage you to find another activity you could do (like a stationary bike or water exercise).  Posture After breast cancer surgery, people frequently sit with rounded shoulders posture because it puts their incisions on slack and feels better. If you sit like this and scar tissue forms in that position, you can become very tight and have pain sitting or standing with good posture. Try to be aware of your posture and sit and stand up tall to heal properly.  Follow up PT: It is recommended you return  every 3 months for the first 2 years following surgery to be assessed on the SOZO machine for an L-Dex score. This helps prevent clinically significant lymphedema in 95% of patients. These follow up screens are 10 minute appointments that you are not billed for.

## 2023-05-13 NOTE — Progress Notes (Signed)
Radiation Oncology         [phone] ________________________________  Outpatient Follow Up - Conducted via telephone at patient request.  I spoke with the patient to conduct this visit via telephone. The patient was notified in advance and was offered an in person or telemedicine meeting to allow for face to face communication but instead preferred to proceed with a telephone visit.  Name: JAQUETA KASAI        MRN: [id number]  Date of Service: 05/15/2023 DOB: October 20, 1960  WU:[id number], Austin Miles, MD  Rachel Moulds, MD     REFERRING PHYSICIAN: Rachel Moulds, MD   DIAGNOSIS: The encounter diagnosis was Malignant neoplasm of lower-outer quadrant of right breast of female, estrogen receptor positive (HCC).   HISTORY OF PRESENT ILLNESS: MARYKATHERINE BANASIK is a 62 y.o. female originally seen in the multidisciplinary breast clinic for a new diagnosis of right breast cancer. The patient was noted to have a palpable mass in the right breast and left calcifications during diagnostic work up. Her calcifications in the left breast were followed, biopsied, and benign concordant to imaging. Her right breast showed a mass in the 6:00 position of the breast measuring 1.4 cm, and her axilla was negative for adenopathy. A biopsy on 02/20/23 showed a grade 2 invasive lobular carcinoma that was ER/PR positive, HER2 negative with a Ki 67 of 3%.   Since her last visit, the patient underwent a right breast lumpectomy and sentinel lymph node biopsy on 04/11/2021 that showed a grade 2 invasive lobular carcinoma measuring 1.4 cm the posterior margin was focally positive for invasive disease and 1 sentinel lymph node was negative for malignancy.  Given the posterior margin abuts the chest wall no additional surgical resection is planned.  Oncotype DX was 10 so systemic chemo is not recommended and she is seen to discuss adjuvant radiotherapy.    PREVIOUS RADIATION THERAPY: No   PAST MEDICAL HISTORY:   Past Medical History:  Diagnosis Date   Actinic keratosis    Allergy    Anemia    Anxiety    Asthma    Blood transfusion without reported diagnosis    Hot flashes        PAST SURGICAL HISTORY: Past Surgical History:  Procedure Laterality Date   ABLATION     BREAST BIOPSY Left 02/20/2023   MM LT BREAST BX W LOC DEV 1ST LESION IMAGE BX SPEC STEREO GUIDE 02/20/2023 [facility]   BREAST BIOPSY Right 02/20/2023   [place] RT BREAST BX W LOC DEV 1ST LESION IMG BX SPEC US GUIDE 02/20/2023 [facility]   BREAST LUMPECTOMY WITH SENTINEL LYMPH NODE BIOPSY Right 04/12/2023   Procedure: RIGHT BREAST LUMPECTOMY WITH SENTINEL LYMPH NODE BX;  Surgeon: Abigail Miyamoto, MD;  Location: [facility];  Service: General;  Laterality: Right;   CESAREAN SECTION     DILATION AND CURETTAGE OF UTERUS     ECTOPIC PREGNANCY SURGERY     TUBAL LIGATION     WISDOM TOOTH EXTRACTION       FAMILY HISTORY:  Family History  Problem Relation Age of Onset   Hypertension Mother    Diabetes Father    Hyperlipidemia Father    Hyperlipidemia Sister    Breast cancer Sister 71   Hyperlipidemia Brother    Hypertension Brother    Esophageal cancer Brother 56 - 28   Colon cancer Maternal Aunt 50 - 69   Liver cancer Maternal Uncle    Stomach cancer Maternal Uncle  Leukemia Paternal Uncle    Skin cancer Paternal Grandmother    Cancer Paternal Grandmother        vulvar cancer   Pancreatic cancer Neg Hx    Rectal cancer Neg Hx      SOCIAL HISTORY:  reports that she has been smoking cigarettes. She has never used smokeless tobacco. She reports current alcohol use. She reports that she does not use drugs. The patient is married and lives in [place]. 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  ALLERGIES: Cefaclor and Sulfa antibiotics   MEDICATIONS:  Current  Outpatient Medications  Medication Sig Dispense Refill   atorvastatin (LIPITOR) 10 MG tablet Take 1 tablet (10 mg total) by mouth daily. 90 tablet 3   buPROPion (WELLBUTRIN XL) 150 MG 24 hr tablet Take 1 tablet (150 mg total) by mouth daily. 90 tablet 3   chlorhexidine (HIBICLENS) 4 % external liquid Apply topically daily as needed. 473 mL 3   loratadine (CLARITIN) 10 MG tablet Take 10 mg by mouth daily.     Multiple Vitamin (MULTIVITAMIN ADULT PO) Take by mouth.     traMADol (ULTRAM) 50 MG tablet Take 1 tablet (50 mg total) by mouth every 6 (six) hours as needed for moderate pain or severe pain. 25 tablet 0   valACYclovir (VALTREX) 500 MG tablet Take 1 tablet (500 mg) by mouth 2 times daily. 90 tablet 3   Zinc 50 MG TABS Take by mouth daily.     No current facility-administered medications for this visit.     REVIEW OF SYSTEMS: On review of systems, the patient reports that she is doing well since surgery without concerns in her healing. She has mild tenderness at the surgical site but no other complaints are verbalized.      PHYSICAL EXAM:  Wt Readings from Last 3 Encounters:  04/26/23 145 lb 6.4 oz (66 kg)  04/23/23 144 lb (65.3 kg)  04/12/23 146 lb 2.6 oz (66.3 kg)   Temp Readings from Last 3 Encounters:  04/26/23 (!) 97.5 F (36.4 C) (Tympanic)  04/23/23 98.3 F (36.8 C) (Oral)  04/12/23 97.8 F (36.6 C)   BP Readings from Last 3 Encounters:  04/26/23 96/78  04/23/23 112/76  04/12/23 110/62   Pulse Readings from Last 3 Encounters:  04/26/23 79  04/23/23 69  04/12/23 68    In general this is a well appearing caucasian female in no acute distress. She's alert and oriented x4 and appropriate throughout the examination. Cardiopulmonary assessment is negative for acute distress and she exhibits normal effort.  Her right breast and axillary incisions site are both well healed without erythema, separation, or drainage.    ECOG = 1  0 - Asymptomatic (Fully active, able  to carry on all predisease activities without restriction)  1 - Symptomatic but completely ambulatory (Restricted in physically strenuous activity but ambulatory and able to carry out work of a light or sedentary nature. For example, light housework, office work)  2 - Symptomatic, <50% in bed during the day (Ambulatory and capable of all self care but unable to carry out any work activities. Up and about more than 50% of waking hours)  3 - Symptomatic, >50% in bed, but not bedbound (Capable of only limited self-care, confined to bed or chair 50% or more of waking hours)  4 - Bedbound (Completely disabled. Cannot carry on  any self-care. Totally confined to bed or chair)  5 - Death   Santiago Glad MM, Creech RH, Tormey DC, et al. [phone]). "Toxicity and response criteria of the [facility]". Am. Evlyn Clines. Oncol. 5 (6): 649-55    LABORATORY DATA:  Lab Results  Component Value Date   WBC 5.4 03/06/2023   HGB 12.0 03/06/2023   HCT 36.0 03/06/2023   MCV 95.2 03/06/2023   PLT 264 03/06/2023   Lab Results  Component Value Date   NA 139 03/06/2023   K 4.0 03/06/2023   CL 106 03/06/2023   CO2 26 03/06/2023   Lab Results  Component Value Date   ALT 13 03/06/2023   AST 15 03/06/2023   ALKPHOS 81 03/06/2023   BILITOT 0.7 03/06/2023      RADIOGRAPHY: No results found.     IMPRESSION/PLAN: 1. Stage IA, pT1cN0M0, grade 2, ER/PR positive invasive lobular carcinoma of the right breast. Dr. Mitzi Hansen has reviewed her final pathology findings and today she and I reviewed the nature of early stage right breast disease. She has done well since surgery and given the positive margin being the chest wall, no additional surgery is planned.  Her Oncotype Dx score does not indicate a role for systemic therapy. Dr. Mitzi Hansen recommends external radiotherapy to the breast  to reduce risks of local recurrence followed by antiestrogen therapy. We discussed the risks, benefits, short, and long term  effects of radiotherapy, as well as the curative intent, and the patient is interested in proceeding. We reviewed the delivery and logistics of radiotherapy and Dr. Mitzi Hansen recommends 4 weeks of radiotherapy to the right breast. Written consent is obtained and placed in the chart, a copy was provided to the patient. She will simulate today.      This encounter was conducted via telephone.  The patient has provided two factor identification and has given verbal consent for this type of encounter and has been advised to only accept a meeting of this type in a secure network environment. The time spent during this encounter was 35 minutes including preparation, discussion, and coordination of the patient's care. The attendants for this meeting include  Ronny Bacon  and Lynn Ito.  During the encounter,  Ronny Bacon was located at [facility] Radiation Oncology Department.  Lynn Ito was located at work.       Osker Mason, [facility]    **Disclaimer: This note was dictated with voice recognition software. Similar sounding words can inadvertently be transcribed and this note may contain transcription errors which may not have been corrected upon publication of note.**

## 2023-05-15 ENCOUNTER — Ambulatory Visit
Admission: RE | Admit: 2023-05-15 | Discharge: 2023-05-15 | Disposition: A | Discharge status: Home / Self Care | Payer: 59 | Source: Ambulatory Visit | Attending: Radiation Oncology | Admitting: Radiation Oncology

## 2023-05-15 ENCOUNTER — Encounter: Payer: Self-pay | Admitting: Radiation Oncology

## 2023-05-15 VITALS — Ht 65.0 in | Wt 142.0 lb

## 2023-05-15 DIAGNOSIS — C50511 Malignant neoplasm of lower-outer quadrant of right female breast: Secondary | ICD-10-CM

## 2023-05-15 DIAGNOSIS — Z170 Estrogen receptor positive status [ER+]: Secondary | ICD-10-CM | POA: Diagnosis not present

## 2023-05-15 DIAGNOSIS — C50512 Malignant neoplasm of lower-outer quadrant of left female breast: Secondary | ICD-10-CM | POA: Insufficient documentation

## 2023-05-15 NOTE — Progress Notes (Signed)
Nursing interview for Malignant neoplasm of lower-outer quadrant of right breast of female, estrogen receptor positive (HCC). Patient identity verified x2.  Patient reports mild tenderness 2/10 at the inferior region of the RT breast. No other issues conveyed at this time.  Meaningful use complete.  Vitals- Ht 5\' 5"  (1.651 m)   Wt 142 lb (64.4 kg)   BMI 23.63 kg/m   This concludes the interaction.  Ruel Favors, LPN

## 2023-05-21 ENCOUNTER — Encounter: Payer: Self-pay | Admitting: *Deleted

## 2023-05-21 DIAGNOSIS — Z170 Estrogen receptor positive status [ER+]: Secondary | ICD-10-CM

## 2023-05-22 ENCOUNTER — Other Ambulatory Visit (HOSPITAL_COMMUNITY): Payer: Self-pay

## 2023-05-22 DIAGNOSIS — Z170 Estrogen receptor positive status [ER+]: Secondary | ICD-10-CM | POA: Diagnosis not present

## 2023-05-22 DIAGNOSIS — C50512 Malignant neoplasm of lower-outer quadrant of left female breast: Secondary | ICD-10-CM | POA: Diagnosis not present

## 2023-05-22 DIAGNOSIS — C50511 Malignant neoplasm of lower-outer quadrant of right female breast: Secondary | ICD-10-CM | POA: Diagnosis not present

## 2023-06-04 ENCOUNTER — Other Ambulatory Visit: Payer: Self-pay

## 2023-06-04 ENCOUNTER — Ambulatory Visit
Admission: RE | Admit: 2023-06-04 | Discharge: 2023-06-04 | Disposition: A | Discharge status: Home / Self Care | Payer: 59 | Source: Ambulatory Visit | Attending: Radiation Oncology | Admitting: Radiation Oncology

## 2023-06-04 DIAGNOSIS — Z923 Personal history of irradiation: Secondary | ICD-10-CM | POA: Diagnosis not present

## 2023-06-04 DIAGNOSIS — Z170 Estrogen receptor positive status [ER+]: Secondary | ICD-10-CM | POA: Diagnosis not present

## 2023-06-04 DIAGNOSIS — C50511 Malignant neoplasm of lower-outer quadrant of right female breast: Secondary | ICD-10-CM | POA: Diagnosis not present

## 2023-06-04 DIAGNOSIS — Z510 Encounter for antineoplastic radiation therapy: Secondary | ICD-10-CM | POA: Diagnosis not present

## 2023-06-04 DIAGNOSIS — C50512 Malignant neoplasm of lower-outer quadrant of left female breast: Secondary | ICD-10-CM | POA: Insufficient documentation

## 2023-06-04 DIAGNOSIS — Z79811 Long term (current) use of aromatase inhibitors: Secondary | ICD-10-CM | POA: Diagnosis not present

## 2023-06-04 LAB — RAD ONC ARIA SESSION SUMMARY
Course Elapsed Days: 0
Plan Fractions Treated to Date: 1
Plan Prescribed Dose Per Fraction: 2.66 Gy
Plan Total Fractions Prescribed: 16
Plan Total Prescribed Dose: 42.56 Gy
Reference Point Dosage Given to Date: 2.66 Gy
Reference Point Session Dosage Given: 2.66 Gy
Session Number: 1

## 2023-06-05 ENCOUNTER — Other Ambulatory Visit: Payer: Self-pay

## 2023-06-05 ENCOUNTER — Ambulatory Visit
Admission: RE | Admit: 2023-06-05 | Discharge: 2023-06-05 | Disposition: A | Discharge status: Home / Self Care | Payer: 59 | Source: Ambulatory Visit | Attending: Radiation Oncology | Admitting: Radiation Oncology

## 2023-06-05 DIAGNOSIS — Z510 Encounter for antineoplastic radiation therapy: Secondary | ICD-10-CM | POA: Diagnosis not present

## 2023-06-05 DIAGNOSIS — Z170 Estrogen receptor positive status [ER+]: Secondary | ICD-10-CM | POA: Diagnosis not present

## 2023-06-05 DIAGNOSIS — C50511 Malignant neoplasm of lower-outer quadrant of right female breast: Secondary | ICD-10-CM | POA: Diagnosis not present

## 2023-06-05 DIAGNOSIS — Z79811 Long term (current) use of aromatase inhibitors: Secondary | ICD-10-CM | POA: Diagnosis not present

## 2023-06-05 DIAGNOSIS — Z923 Personal history of irradiation: Secondary | ICD-10-CM | POA: Diagnosis not present

## 2023-06-05 LAB — RAD ONC ARIA SESSION SUMMARY
Course Elapsed Days: 1
Plan Fractions Treated to Date: 2
Plan Prescribed Dose Per Fraction: 2.66 Gy
Plan Total Fractions Prescribed: 16
Plan Total Prescribed Dose: 42.56 Gy
Reference Point Dosage Given to Date: 5.32 Gy
Reference Point Session Dosage Given: 2.66 Gy
Session Number: 2

## 2023-06-06 ENCOUNTER — Other Ambulatory Visit: Payer: Self-pay

## 2023-06-06 ENCOUNTER — Ambulatory Visit
Admission: RE | Admit: 2023-06-06 | Discharge: 2023-06-06 | Disposition: A | Discharge status: Home / Self Care | Payer: 59 | Source: Ambulatory Visit | Attending: Radiation Oncology | Admitting: Radiation Oncology

## 2023-06-06 DIAGNOSIS — Z923 Personal history of irradiation: Secondary | ICD-10-CM | POA: Diagnosis not present

## 2023-06-06 DIAGNOSIS — C50511 Malignant neoplasm of lower-outer quadrant of right female breast: Secondary | ICD-10-CM | POA: Diagnosis not present

## 2023-06-06 DIAGNOSIS — Z510 Encounter for antineoplastic radiation therapy: Secondary | ICD-10-CM | POA: Diagnosis not present

## 2023-06-06 DIAGNOSIS — Z79811 Long term (current) use of aromatase inhibitors: Secondary | ICD-10-CM | POA: Diagnosis not present

## 2023-06-06 DIAGNOSIS — Z170 Estrogen receptor positive status [ER+]: Secondary | ICD-10-CM | POA: Diagnosis not present

## 2023-06-06 LAB — RAD ONC ARIA SESSION SUMMARY
Course Elapsed Days: 2
Plan Fractions Treated to Date: 3
Plan Prescribed Dose Per Fraction: 2.66 Gy
Plan Total Fractions Prescribed: 16
Plan Total Prescribed Dose: 42.56 Gy
Reference Point Dosage Given to Date: 7.98 Gy
Reference Point Session Dosage Given: 2.66 Gy
Session Number: 3

## 2023-06-07 ENCOUNTER — Other Ambulatory Visit: Payer: Self-pay

## 2023-06-07 ENCOUNTER — Ambulatory Visit
Admission: RE | Admit: 2023-06-07 | Discharge: 2023-06-07 | Disposition: A | Discharge status: Home / Self Care | Payer: 59 | Source: Ambulatory Visit | Attending: Radiation Oncology | Admitting: Radiation Oncology

## 2023-06-07 DIAGNOSIS — Z79811 Long term (current) use of aromatase inhibitors: Secondary | ICD-10-CM | POA: Diagnosis not present

## 2023-06-07 DIAGNOSIS — Z510 Encounter for antineoplastic radiation therapy: Secondary | ICD-10-CM | POA: Diagnosis not present

## 2023-06-07 DIAGNOSIS — Z923 Personal history of irradiation: Secondary | ICD-10-CM | POA: Diagnosis not present

## 2023-06-07 DIAGNOSIS — Z170 Estrogen receptor positive status [ER+]: Secondary | ICD-10-CM | POA: Diagnosis not present

## 2023-06-07 DIAGNOSIS — C50511 Malignant neoplasm of lower-outer quadrant of right female breast: Secondary | ICD-10-CM | POA: Diagnosis not present

## 2023-06-07 LAB — RAD ONC ARIA SESSION SUMMARY
Course Elapsed Days: 3
Plan Fractions Treated to Date: 4
Plan Prescribed Dose Per Fraction: 2.66 Gy
Plan Total Fractions Prescribed: 16
Plan Total Prescribed Dose: 42.56 Gy
Reference Point Dosage Given to Date: 10.64 Gy
Reference Point Session Dosage Given: 2.66 Gy
Session Number: 4

## 2023-06-10 ENCOUNTER — Ambulatory Visit
Admission: RE | Admit: 2023-06-10 | Discharge: 2023-06-10 | Disposition: A | Discharge status: Home / Self Care | Payer: 59 | Source: Ambulatory Visit | Attending: Radiation Oncology

## 2023-06-10 ENCOUNTER — Other Ambulatory Visit: Payer: Self-pay

## 2023-06-10 DIAGNOSIS — Z923 Personal history of irradiation: Secondary | ICD-10-CM | POA: Diagnosis not present

## 2023-06-10 DIAGNOSIS — Z170 Estrogen receptor positive status [ER+]: Secondary | ICD-10-CM | POA: Diagnosis not present

## 2023-06-10 DIAGNOSIS — Z510 Encounter for antineoplastic radiation therapy: Secondary | ICD-10-CM | POA: Diagnosis not present

## 2023-06-10 DIAGNOSIS — C50511 Malignant neoplasm of lower-outer quadrant of right female breast: Secondary | ICD-10-CM | POA: Diagnosis not present

## 2023-06-10 DIAGNOSIS — Z79811 Long term (current) use of aromatase inhibitors: Secondary | ICD-10-CM | POA: Diagnosis not present

## 2023-06-10 LAB — RAD ONC ARIA SESSION SUMMARY
Course Elapsed Days: 6
Plan Fractions Treated to Date: 5
Plan Prescribed Dose Per Fraction: 2.66 Gy
Plan Total Fractions Prescribed: 16
Plan Total Prescribed Dose: 42.56 Gy
Reference Point Dosage Given to Date: 13.3 Gy
Reference Point Session Dosage Given: 2.66 Gy
Session Number: 5

## 2023-06-11 ENCOUNTER — Other Ambulatory Visit: Payer: Self-pay

## 2023-06-11 ENCOUNTER — Ambulatory Visit
Admission: RE | Admit: 2023-06-11 | Discharge: 2023-06-11 | Disposition: A | Discharge status: Home / Self Care | Payer: 59 | Source: Ambulatory Visit | Attending: Radiation Oncology | Admitting: Radiation Oncology

## 2023-06-11 DIAGNOSIS — Z510 Encounter for antineoplastic radiation therapy: Secondary | ICD-10-CM | POA: Diagnosis not present

## 2023-06-11 DIAGNOSIS — Z170 Estrogen receptor positive status [ER+]: Secondary | ICD-10-CM | POA: Diagnosis not present

## 2023-06-11 DIAGNOSIS — Z79811 Long term (current) use of aromatase inhibitors: Secondary | ICD-10-CM | POA: Diagnosis not present

## 2023-06-11 DIAGNOSIS — Z923 Personal history of irradiation: Secondary | ICD-10-CM | POA: Diagnosis not present

## 2023-06-11 DIAGNOSIS — C50511 Malignant neoplasm of lower-outer quadrant of right female breast: Secondary | ICD-10-CM | POA: Diagnosis not present

## 2023-06-11 LAB — RAD ONC ARIA SESSION SUMMARY
Course Elapsed Days: 7
Plan Fractions Treated to Date: 6
Plan Prescribed Dose Per Fraction: 2.66 Gy
Plan Total Fractions Prescribed: 16
Plan Total Prescribed Dose: 42.56 Gy
Reference Point Dosage Given to Date: 15.96 Gy
Reference Point Session Dosage Given: 2.66 Gy
Session Number: 6

## 2023-06-12 ENCOUNTER — Ambulatory Visit
Admission: RE | Admit: 2023-06-12 | Discharge: 2023-06-12 | Disposition: A | Discharge status: Home / Self Care | Payer: 59 | Source: Ambulatory Visit | Attending: Radiation Oncology

## 2023-06-12 ENCOUNTER — Other Ambulatory Visit: Payer: Self-pay

## 2023-06-12 DIAGNOSIS — Z923 Personal history of irradiation: Secondary | ICD-10-CM | POA: Diagnosis not present

## 2023-06-12 DIAGNOSIS — C50511 Malignant neoplasm of lower-outer quadrant of right female breast: Secondary | ICD-10-CM | POA: Diagnosis not present

## 2023-06-12 DIAGNOSIS — Z170 Estrogen receptor positive status [ER+]: Secondary | ICD-10-CM | POA: Diagnosis not present

## 2023-06-12 DIAGNOSIS — Z510 Encounter for antineoplastic radiation therapy: Secondary | ICD-10-CM | POA: Diagnosis not present

## 2023-06-12 DIAGNOSIS — Z79811 Long term (current) use of aromatase inhibitors: Secondary | ICD-10-CM | POA: Diagnosis not present

## 2023-06-12 LAB — RAD ONC ARIA SESSION SUMMARY
Course Elapsed Days: 8
Plan Fractions Treated to Date: 7
Plan Prescribed Dose Per Fraction: 2.66 Gy
Plan Total Fractions Prescribed: 16
Plan Total Prescribed Dose: 42.56 Gy
Reference Point Dosage Given to Date: 18.62 Gy
Reference Point Session Dosage Given: 2.66 Gy
Session Number: 7

## 2023-06-13 ENCOUNTER — Other Ambulatory Visit: Payer: Self-pay

## 2023-06-13 ENCOUNTER — Ambulatory Visit
Admission: RE | Admit: 2023-06-13 | Discharge: 2023-06-13 | Disposition: A | Discharge status: Home / Self Care | Payer: 59 | Source: Ambulatory Visit | Attending: Radiation Oncology | Admitting: Radiation Oncology

## 2023-06-13 DIAGNOSIS — Z923 Personal history of irradiation: Secondary | ICD-10-CM | POA: Diagnosis not present

## 2023-06-13 DIAGNOSIS — Z170 Estrogen receptor positive status [ER+]: Secondary | ICD-10-CM | POA: Diagnosis not present

## 2023-06-13 DIAGNOSIS — C50511 Malignant neoplasm of lower-outer quadrant of right female breast: Secondary | ICD-10-CM | POA: Diagnosis not present

## 2023-06-13 DIAGNOSIS — Z79811 Long term (current) use of aromatase inhibitors: Secondary | ICD-10-CM | POA: Diagnosis not present

## 2023-06-13 DIAGNOSIS — Z510 Encounter for antineoplastic radiation therapy: Secondary | ICD-10-CM | POA: Diagnosis not present

## 2023-06-13 LAB — RAD ONC ARIA SESSION SUMMARY
Course Elapsed Days: 9
Plan Fractions Treated to Date: 8
Plan Prescribed Dose Per Fraction: 2.66 Gy
Plan Total Fractions Prescribed: 16
Plan Total Prescribed Dose: 42.56 Gy
Reference Point Dosage Given to Date: 21.28 Gy
Reference Point Session Dosage Given: 2.66 Gy
Session Number: 8

## 2023-06-14 ENCOUNTER — Ambulatory Visit
Admission: RE | Admit: 2023-06-14 | Discharge: 2023-06-14 | Disposition: A | Discharge status: Home / Self Care | Payer: 59 | Source: Ambulatory Visit | Attending: Radiation Oncology

## 2023-06-14 ENCOUNTER — Other Ambulatory Visit: Payer: Self-pay

## 2023-06-14 DIAGNOSIS — Z79811 Long term (current) use of aromatase inhibitors: Secondary | ICD-10-CM | POA: Diagnosis not present

## 2023-06-14 DIAGNOSIS — Z510 Encounter for antineoplastic radiation therapy: Secondary | ICD-10-CM | POA: Diagnosis not present

## 2023-06-14 DIAGNOSIS — Z170 Estrogen receptor positive status [ER+]: Secondary | ICD-10-CM | POA: Diagnosis not present

## 2023-06-14 DIAGNOSIS — Z923 Personal history of irradiation: Secondary | ICD-10-CM | POA: Diagnosis not present

## 2023-06-14 DIAGNOSIS — C50511 Malignant neoplasm of lower-outer quadrant of right female breast: Secondary | ICD-10-CM | POA: Diagnosis not present

## 2023-06-14 LAB — RAD ONC ARIA SESSION SUMMARY
Course Elapsed Days: 10
Plan Fractions Treated to Date: 9
Plan Prescribed Dose Per Fraction: 2.66 Gy
Plan Total Fractions Prescribed: 16
Plan Total Prescribed Dose: 42.56 Gy
Reference Point Dosage Given to Date: 23.94 Gy
Reference Point Session Dosage Given: 2.66 Gy
Session Number: 9

## 2023-06-17 ENCOUNTER — Other Ambulatory Visit: Payer: Self-pay

## 2023-06-17 ENCOUNTER — Ambulatory Visit
Admission: RE | Admit: 2023-06-17 | Discharge: 2023-06-17 | Disposition: A | Discharge status: Home / Self Care | Payer: 59 | Source: Ambulatory Visit | Attending: Radiation Oncology

## 2023-06-17 DIAGNOSIS — C50511 Malignant neoplasm of lower-outer quadrant of right female breast: Secondary | ICD-10-CM | POA: Diagnosis not present

## 2023-06-17 DIAGNOSIS — Z79811 Long term (current) use of aromatase inhibitors: Secondary | ICD-10-CM | POA: Diagnosis not present

## 2023-06-17 DIAGNOSIS — Z170 Estrogen receptor positive status [ER+]: Secondary | ICD-10-CM | POA: Diagnosis not present

## 2023-06-17 DIAGNOSIS — Z510 Encounter for antineoplastic radiation therapy: Secondary | ICD-10-CM | POA: Diagnosis not present

## 2023-06-17 DIAGNOSIS — Z923 Personal history of irradiation: Secondary | ICD-10-CM | POA: Diagnosis not present

## 2023-06-17 LAB — RAD ONC ARIA SESSION SUMMARY
Course Elapsed Days: 13
Plan Fractions Treated to Date: 10
Plan Prescribed Dose Per Fraction: 2.66 Gy
Plan Total Fractions Prescribed: 16
Plan Total Prescribed Dose: 42.56 Gy
Reference Point Dosage Given to Date: 26.6 Gy
Reference Point Session Dosage Given: 2.66 Gy
Session Number: 10

## 2023-06-18 ENCOUNTER — Ambulatory Visit
Admission: RE | Admit: 2023-06-18 | Discharge: 2023-06-18 | Disposition: A | Discharge status: Home / Self Care | Payer: 59 | Source: Ambulatory Visit | Attending: Radiation Oncology

## 2023-06-18 ENCOUNTER — Other Ambulatory Visit: Payer: Self-pay

## 2023-06-18 DIAGNOSIS — Z79811 Long term (current) use of aromatase inhibitors: Secondary | ICD-10-CM | POA: Diagnosis not present

## 2023-06-18 DIAGNOSIS — C50511 Malignant neoplasm of lower-outer quadrant of right female breast: Secondary | ICD-10-CM | POA: Diagnosis not present

## 2023-06-18 DIAGNOSIS — Z923 Personal history of irradiation: Secondary | ICD-10-CM | POA: Diagnosis not present

## 2023-06-18 DIAGNOSIS — Z170 Estrogen receptor positive status [ER+]: Secondary | ICD-10-CM | POA: Diagnosis not present

## 2023-06-18 DIAGNOSIS — Z510 Encounter for antineoplastic radiation therapy: Secondary | ICD-10-CM | POA: Diagnosis not present

## 2023-06-18 LAB — RAD ONC ARIA SESSION SUMMARY
Course Elapsed Days: 14
Plan Fractions Treated to Date: 11
Plan Prescribed Dose Per Fraction: 2.66 Gy
Plan Total Fractions Prescribed: 16
Plan Total Prescribed Dose: 42.56 Gy
Reference Point Dosage Given to Date: 29.26 Gy
Reference Point Session Dosage Given: 2.66 Gy
Session Number: 11

## 2023-06-19 ENCOUNTER — Other Ambulatory Visit: Payer: Self-pay

## 2023-06-19 ENCOUNTER — Ambulatory Visit
Admission: RE | Admit: 2023-06-19 | Discharge: 2023-06-19 | Disposition: A | Discharge status: Home / Self Care | Payer: 59 | Source: Ambulatory Visit | Attending: Radiation Oncology

## 2023-06-19 DIAGNOSIS — Z170 Estrogen receptor positive status [ER+]: Secondary | ICD-10-CM | POA: Diagnosis not present

## 2023-06-19 DIAGNOSIS — Z923 Personal history of irradiation: Secondary | ICD-10-CM | POA: Diagnosis not present

## 2023-06-19 DIAGNOSIS — Z79811 Long term (current) use of aromatase inhibitors: Secondary | ICD-10-CM | POA: Diagnosis not present

## 2023-06-19 DIAGNOSIS — Z510 Encounter for antineoplastic radiation therapy: Secondary | ICD-10-CM | POA: Diagnosis not present

## 2023-06-19 DIAGNOSIS — C50511 Malignant neoplasm of lower-outer quadrant of right female breast: Secondary | ICD-10-CM | POA: Diagnosis not present

## 2023-06-19 LAB — RAD ONC ARIA SESSION SUMMARY
Course Elapsed Days: 15
Plan Fractions Treated to Date: 12
Plan Prescribed Dose Per Fraction: 2.66 Gy
Plan Total Fractions Prescribed: 16
Plan Total Prescribed Dose: 42.56 Gy
Reference Point Dosage Given to Date: 31.92 Gy
Reference Point Session Dosage Given: 2.66 Gy
Session Number: 12

## 2023-06-20 ENCOUNTER — Ambulatory Visit
Admission: RE | Admit: 2023-06-20 | Discharge: 2023-06-20 | Disposition: A | Discharge status: Home / Self Care | Payer: 59 | Source: Ambulatory Visit | Attending: Radiation Oncology | Admitting: Radiation Oncology

## 2023-06-20 ENCOUNTER — Other Ambulatory Visit: Payer: Self-pay

## 2023-06-20 DIAGNOSIS — C50511 Malignant neoplasm of lower-outer quadrant of right female breast: Secondary | ICD-10-CM | POA: Diagnosis not present

## 2023-06-20 DIAGNOSIS — Z923 Personal history of irradiation: Secondary | ICD-10-CM | POA: Diagnosis not present

## 2023-06-20 DIAGNOSIS — Z79811 Long term (current) use of aromatase inhibitors: Secondary | ICD-10-CM | POA: Diagnosis not present

## 2023-06-20 DIAGNOSIS — Z510 Encounter for antineoplastic radiation therapy: Secondary | ICD-10-CM | POA: Diagnosis not present

## 2023-06-20 DIAGNOSIS — Z170 Estrogen receptor positive status [ER+]: Secondary | ICD-10-CM | POA: Diagnosis not present

## 2023-06-20 LAB — RAD ONC ARIA SESSION SUMMARY
Course Elapsed Days: 16
Plan Fractions Treated to Date: 13
Plan Prescribed Dose Per Fraction: 2.66 Gy
Plan Total Fractions Prescribed: 16
Plan Total Prescribed Dose: 42.56 Gy
Reference Point Dosage Given to Date: 34.58 Gy
Reference Point Session Dosage Given: 2.66 Gy
Session Number: 13

## 2023-06-21 ENCOUNTER — Ambulatory Visit
Admission: RE | Admit: 2023-06-21 | Discharge: 2023-06-21 | Disposition: A | Discharge status: Home / Self Care | Payer: 59 | Source: Ambulatory Visit | Attending: Radiation Oncology | Admitting: Radiation Oncology

## 2023-06-21 ENCOUNTER — Other Ambulatory Visit: Payer: Self-pay

## 2023-06-21 ENCOUNTER — Ambulatory Visit: Payer: 59 | Admitting: Radiation Oncology

## 2023-06-21 DIAGNOSIS — Z923 Personal history of irradiation: Secondary | ICD-10-CM | POA: Diagnosis not present

## 2023-06-21 DIAGNOSIS — Z170 Estrogen receptor positive status [ER+]: Secondary | ICD-10-CM | POA: Diagnosis not present

## 2023-06-21 DIAGNOSIS — C50511 Malignant neoplasm of lower-outer quadrant of right female breast: Secondary | ICD-10-CM | POA: Diagnosis not present

## 2023-06-21 DIAGNOSIS — Z79811 Long term (current) use of aromatase inhibitors: Secondary | ICD-10-CM | POA: Diagnosis not present

## 2023-06-21 DIAGNOSIS — Z510 Encounter for antineoplastic radiation therapy: Secondary | ICD-10-CM | POA: Diagnosis not present

## 2023-06-21 LAB — RAD ONC ARIA SESSION SUMMARY
Course Elapsed Days: 17
Plan Fractions Treated to Date: 14
Plan Prescribed Dose Per Fraction: 2.66 Gy
Plan Total Fractions Prescribed: 16
Plan Total Prescribed Dose: 42.56 Gy
Reference Point Dosage Given to Date: 37.24 Gy
Reference Point Session Dosage Given: 2.66 Gy
Session Number: 14

## 2023-06-24 ENCOUNTER — Ambulatory Visit
Admission: RE | Admit: 2023-06-24 | Discharge: 2023-06-24 | Disposition: A | Discharge status: Home / Self Care | Payer: 59 | Source: Ambulatory Visit | Attending: Radiation Oncology | Admitting: Radiation Oncology

## 2023-06-24 ENCOUNTER — Other Ambulatory Visit: Payer: Self-pay

## 2023-06-24 DIAGNOSIS — Z170 Estrogen receptor positive status [ER+]: Secondary | ICD-10-CM | POA: Diagnosis not present

## 2023-06-24 DIAGNOSIS — Z79811 Long term (current) use of aromatase inhibitors: Secondary | ICD-10-CM | POA: Diagnosis not present

## 2023-06-24 DIAGNOSIS — C50511 Malignant neoplasm of lower-outer quadrant of right female breast: Secondary | ICD-10-CM | POA: Diagnosis not present

## 2023-06-24 DIAGNOSIS — Z923 Personal history of irradiation: Secondary | ICD-10-CM | POA: Diagnosis not present

## 2023-06-24 DIAGNOSIS — Z510 Encounter for antineoplastic radiation therapy: Secondary | ICD-10-CM | POA: Diagnosis not present

## 2023-06-24 LAB — RAD ONC ARIA SESSION SUMMARY
Course Elapsed Days: 20
Plan Fractions Treated to Date: 15
Plan Prescribed Dose Per Fraction: 2.66 Gy
Plan Total Fractions Prescribed: 16
Plan Total Prescribed Dose: 42.56 Gy
Reference Point Dosage Given to Date: 39.9 Gy
Reference Point Session Dosage Given: 2.66 Gy
Session Number: 15

## 2023-06-25 ENCOUNTER — Inpatient Hospital Stay: Payer: 59 | Attending: Hematology and Oncology | Admitting: Hematology and Oncology

## 2023-06-25 ENCOUNTER — Encounter: Payer: Self-pay | Admitting: *Deleted

## 2023-06-25 ENCOUNTER — Other Ambulatory Visit (HOSPITAL_COMMUNITY): Payer: Self-pay

## 2023-06-25 ENCOUNTER — Ambulatory Visit
Admission: RE | Admit: 2023-06-25 | Discharge: 2023-06-25 | Disposition: A | Discharge status: Home / Self Care | Payer: 59 | Source: Ambulatory Visit | Attending: Radiation Oncology

## 2023-06-25 ENCOUNTER — Other Ambulatory Visit: Payer: Self-pay

## 2023-06-25 VITALS — BP 101/49 | HR 72 | Temp 98.0°F | Resp 17 | Ht 65.0 in | Wt 148.8 lb

## 2023-06-25 DIAGNOSIS — Z79811 Long term (current) use of aromatase inhibitors: Secondary | ICD-10-CM | POA: Diagnosis not present

## 2023-06-25 DIAGNOSIS — Z923 Personal history of irradiation: Secondary | ICD-10-CM | POA: Insufficient documentation

## 2023-06-25 DIAGNOSIS — Z170 Estrogen receptor positive status [ER+]: Secondary | ICD-10-CM | POA: Diagnosis not present

## 2023-06-25 DIAGNOSIS — C50511 Malignant neoplasm of lower-outer quadrant of right female breast: Secondary | ICD-10-CM

## 2023-06-25 DIAGNOSIS — Z510 Encounter for antineoplastic radiation therapy: Secondary | ICD-10-CM | POA: Diagnosis not present

## 2023-06-25 LAB — RAD ONC ARIA SESSION SUMMARY
Course Elapsed Days: 21
Plan Fractions Treated to Date: 16
Plan Prescribed Dose Per Fraction: 2.66 Gy
Plan Total Fractions Prescribed: 16
Plan Total Prescribed Dose: 42.56 Gy
Reference Point Dosage Given to Date: 42.56 Gy
Reference Point Session Dosage Given: 2.66 Gy
Session Number: 16

## 2023-06-25 MED ORDER — ANASTROZOLE 1 MG PO TABS
1.0000 mg | ORAL_TABLET | Freq: Every day | ORAL | 3 refills | Status: DC
  Filled 2023-06-25 – 2023-07-09 (×2): qty 90, 90d supply, fill #0
  Filled 2023-11-04: qty 90, 90d supply, fill #1
  Filled 2024-02-21: qty 90, 90d supply, fill #2
  Filled 2024-06-01: qty 90, 90d supply, fill #3

## 2023-06-25 NOTE — Progress Notes (Signed)
[facility] CONSULT NOTE  Patient Care Team: Myrlene Broker, MD as PCP - General (Internal Medicine) Rachel Moulds, MD as Consulting Physician (Hematology and Oncology) Abigail Miyamoto, MD as Consulting Physician (General Surgery) Dorothy Puffer, MD as Consulting Physician (Radiation Oncology) Pershing Proud, RN as Oncology Nurse Navigator Donnelly Angelica, RN as Oncology Nurse Navigator  CHIEF COMPLAINTS/PURPOSE OF CONSULTATION:  Newly diagnosed breast cancer  HISTORY OF PRESENTING ILLNESS:  Abigail Foley 62 y.o. female is here because of recent diagnosis of right breast cancer  I reviewed her records extensively and collaborated the history with the patient.  SUMMARY OF ONCOLOGIC HISTORY: Oncology History  Malignant neoplasm of lower-outer quadrant of right breast of female, estrogen receptor positive (HCC)  02/12/2023 Mammogram   Diagnostic mammogram showed suspicious right breast mass corresponding with patient's palpable lump, recommend ultrasound-guided biopsy.  Indeterminate left breast calcs, recommend stereotactic biopsy.  No suspicious right axillary lymphadenopathy.   02/20/2023 Pathology Results   Right breast needle core biopsy showed overall grade 2 invasive mammary carcinoma, consistent with lobular phenotype, left breast needle core biopsy in the upper outer breast showed Fibrocystic changes with calcs, negative for carcinoma.  Prognostics from the invasive lobular carcinoma showed ER 90% positive strong staining PR 5% positive strong staining Ki-67 of 3% HER2 1+   03/05/2023 Initial Diagnosis   Malignant neoplasm of lower-outer quadrant of right breast of female, estrogen receptor positive (HCC)   03/06/2023 Cancer Staging   Staging form: Breast, AJCC 8th Edition - Clinical stage from 03/06/2023: Stage IA (cT1c, cN0, cM0, G2, ER+, PR+, HER2-) - Signed by Ronny Bacon, PA-C on 03/06/2023 Stage prefix: Initial diagnosis Histologic grading  system: 3 grade system    Genetic Testing   [facility] Common Cancer Panel+RNA was Negative. Report date is 03/12/2023.  The Common Hereditary Cancers Panel offered by [facility] includes sequencing and/or deletion duplication testing of the following 48 genes: APC, ATM, AXIN2, BAP1, BARD1, BMPR1A, BRCA1, BRCA2, BRIP1, CDH1, CDK4, CDKN2A (p14ARF and p16INK4a only), CHEK2, CTNNA1, DICER1, EPCAM (Deletion/duplication testing only), FH, GREM1 (promoter region duplication testing only), HOXB13, KIT, MBD4, MEN1, MLH1, MSH2, MSH3, MSH6, MUTYH, NF1, NHTL1, PALB2, PDGFRA, PMS2, POLD1, POLE, PTEN, RAD51C, RAD51D, SDHA (sequencing analysis only except exon 14), SDHB, SDHC, SDHD, SMAD4, SMARCA4. STK11, TP53, TSC1, TSC2, and VHL.   04/12/2023 Definitive Surgery   Since her last visit she had right breast lumpectomy and this showed grade 2 1.4 cm measuring invasive lobular carcinoma, posterior margin focally positive for invasive carcinoma however no further surgery planned since posterior margin is the pectoralis major muscle.  Negative for lymphovascular or perineural invasion right axillary sentinel lymph node was negative for carcinoma.  Prior prognostic showed ER 90% positive strong staining PR 5% positive strong staining, HER2 negative 1+    The patient, with a history of breast cancer, has completed active treatment and is now considering options for hormone therapy. She is doing well overall. She is hoping to try aromatase inhibitors. Rest of the pertinent 10 point ROS reviewed and neg  MEDICAL HISTORY:  Past Medical History:  Diagnosis Date   Actinic keratosis    Allergy    Anemia    Anxiety    Asthma    Blood transfusion without reported diagnosis    Hot flashes     SURGICAL HISTORY: Past Surgical History:  Procedure Laterality Date   ABLATION     BREAST BIOPSY Left 02/20/2023   MM LT BREAST BX W LOC DEV 1ST  LESION IMAGE BX SPEC STEREO GUIDE 02/20/2023 [facility]   BREAST BIOPSY Right  02/20/2023   [place] RT BREAST BX W LOC DEV 1ST LESION IMG BX SPEC US GUIDE 02/20/2023 [facility]   BREAST LUMPECTOMY WITH SENTINEL LYMPH NODE BIOPSY Right 04/12/2023   Procedure: RIGHT BREAST LUMPECTOMY WITH SENTINEL LYMPH NODE BX;  Surgeon: Abigail Miyamoto, MD;  Location: [facility];  Service: General;  Laterality: Right;   CESAREAN SECTION     DILATION AND CURETTAGE OF UTERUS     ECTOPIC PREGNANCY SURGERY     TUBAL LIGATION     WISDOM TOOTH EXTRACTION      SOCIAL HISTORY: Social History   Socioeconomic History   Marital status: Married    Spouse name: Not on file   Number of children: Not on file   Years of education: Not on file   Highest education level: Not on file  Occupational History   Not on file  Tobacco Use   Smoking status: Some Days    Current packs/day: 0.00    Types: Cigarettes    Last attempt to quit: 10/01/2005    Years since quitting: 17.7   Smokeless tobacco: Never  Substance and Sexual Activity   Alcohol use: Yes    Comment: occasional   Drug use: No   Sexual activity: Not on file  Other Topics Concern   Not on file  Social History Narrative   Not on file   Social Determinants of Health   Financial Resource Strain: Not on file  Food Insecurity: No Food Insecurity (05/15/2023)   Hunger Vital Sign    Worried About Running Out of Food in the Last Year: Never true    Ran Out of Food in the Last Year: Never true  Transportation Needs: No Transportation Needs (03/06/2023)   PRAPARE - Administrator, Civil Service (Medical): No    Lack of Transportation (Non-Medical): No  Physical Activity: Not on file  Stress: Not on file  Social Connections: Not on file  Intimate Partner Violence: Not At Risk (03/06/2023)   Humiliation, Afraid, Rape, and Kick questionnaire    Fear of Current or Ex-Partner: No    Emotionally Abused: No    Physically Abused: No    Sexually Abused: No    FAMILY HISTORY: Family History  Problem Relation  Age of Onset   Hypertension Mother    Diabetes Father    Hyperlipidemia Father    Hyperlipidemia Sister    Breast cancer Sister 65   Hyperlipidemia Brother    Hypertension Brother    Esophageal cancer Brother 53 - 71   Colon cancer Maternal Aunt 27 - 69   Liver cancer Maternal Uncle    Stomach cancer Maternal Uncle    Leukemia Paternal Uncle    Skin cancer Paternal Grandmother    Cancer Paternal Grandmother        vulvar cancer   Pancreatic cancer Neg Hx    Rectal cancer Neg Hx     ALLERGIES:  is allergic to cefaclor and sulfa antibiotics.  MEDICATIONS:  Current Outpatient Medications  Medication Sig Dispense Refill   anastrozole (ARIMIDEX) 1 MG tablet Take 1 tablet (1 mg total) by mouth daily. 90 tablet 3   atorvastatin (LIPITOR) 10 MG tablet Take 1 tablet (10 mg total) by mouth daily. 90 tablet 3   buPROPion (WELLBUTRIN XL) 150 MG 24 hr tablet Take 1 tablet (150 mg total) by mouth daily. 90 tablet 3   chlorhexidine (  HIBICLENS) 4 % external liquid Apply topically daily as needed. 473 mL 3   loratadine (CLARITIN) 10 MG tablet Take 10 mg by mouth daily.     Multiple Vitamin (MULTIVITAMIN ADULT PO) Take by mouth.     valACYclovir (VALTREX) 500 MG tablet Take 1 tablet (500 mg) by mouth 2 times daily. 90 tablet 3   Zinc 50 MG TABS Take by mouth daily.     No current facility-administered medications for this visit.    REVIEW OF SYSTEMS:   Constitutional: Denies fevers, chills or abnormal night sweats Eyes: Denies blurriness of vision, double vision or watery eyes Ears, nose, mouth, throat, and face: Denies mucositis or sore throat Respiratory: Denies cough, dyspnea or wheezes Cardiovascular: Denies palpitation, chest discomfort or lower extremity swelling Gastrointestinal:  Denies nausea, heartburn or change in bowel habits Skin: Denies abnormal skin rashes Lymphatics: Denies new lymphadenopathy or easy bruising Neurological:Denies numbness, tingling or new  weaknesses Behavioral/Psych: Mood is stable, no new changes  Breast: Denies any palpable lumps or discharge All other systems were reviewed with the patient and are negative.  PHYSICAL EXAMINATION: ECOG PERFORMANCE STATUS: 0 - Asymptomatic  Vitals:   06/25/23 1503  BP: (!) 101/49  Pulse: 72  Resp: 17  Temp: 98 F (36.7 C)  SpO2: 100%   Filed Weights   06/25/23 1503  Weight: 148 lb 12.8 oz (67.5 kg)    GENERAL:alert, no distress and comfortable   LABORATORY DATA:  I have reviewed the data as listed Lab Results  Component Value Date   WBC 5.4 03/06/2023   HGB 12.0 03/06/2023   HCT 36.0 03/06/2023   MCV 95.2 03/06/2023   PLT 264 03/06/2023   Lab Results  Component Value Date   NA 139 03/06/2023   K 4.0 03/06/2023   CL 106 03/06/2023   CO2 26 03/06/2023    RADIOGRAPHIC STUDIES: I have personally reviewed the radiological reports and agreed with the findings in the report.  ASSESSMENT AND PLAN:  Malignant neoplasm of lower-outer quadrant of right breast of female, estrogen receptor positive (HCC) This is a very pleasant 62 year old postmenopausal female patient with newly diagnosed right breast grade 2 ILC, ER 90% positive strong staining PR 5% positive strong staining Ki-67 of 3% HER2 1+ refer to breast MDC for additional recommendations.  She is healthy at baseline.  Given small tumor, strong ER positivity she will proceed with upfront surgery followed by Oncotype DX testing.  She is now status post lumpectomy with final pathology showing grade 1 invasive lobular carcinoma, posterior margin positive.  1 sentinel lymph node negative.  No additional surgery planned since the posterior margin is the pectoralis major muscle.  Oncotype DX of 10 hence there is no role for adjuvant chemotherapy.  She has now completed adjuvant radiation.  Breast Cancer Completed active treatment. Discussed the benefits and side effects of Tamoxifen and Aromatase Inhibitors. Patient prefers  Aromatase Inhibitors due to lower risk of DVT and uterine complications. -Prescribe Aromatase Inhibitors, to be started in the first or second week of October. -Plan for a survivorship visit with Lillard Anes. -Alternate follow-ups with surgeon and oncologist annually.  Bone Health Discussed potential bone density loss with Aromatase Inhibitors. -Order baseline bone density scan, to be repeated every two years while on medication.  Breast Health Maintenance Discussed the need for continued mammograms. -Continue with annual diagnostic mammograms for up to five years, then revert to screening mammograms.    All questions were answered. The patient knows to call  the clinic with any problems, questions or concerns.    Rachel Moulds, MD 06/25/23

## 2023-06-25 NOTE — Assessment & Plan Note (Signed)
This is a very pleasant 62 year old postmenopausal female patient with newly diagnosed right breast grade 2 ILC, ER 90% positive strong staining PR 5% positive strong staining Ki-67 of 3% HER2 1+ refer to breast MDC for additional recommendations.  She is healthy at baseline.  Given small tumor, strong ER positivity she will proceed with upfront surgery followed by Oncotype DX testing.  She is now status post lumpectomy with final pathology showing grade 1 invasive lobular carcinoma, posterior margin positive.  1 sentinel lymph node negative.  No additional surgery planned since the posterior margin is the pectoralis major muscle.  Oncotype DX of 10 hence there is no role for adjuvant chemotherapy.  She has now completed adjuvant radiation.  Breast Cancer Completed active treatment. Discussed the benefits and side effects of Tamoxifen and Aromatase Inhibitors. Patient prefers Aromatase Inhibitors due to lower risk of DVT and uterine complications. -Prescribe Aromatase Inhibitors, to be started in the first or second week of October. -Plan for a survivorship visit with Lillard Anes. -Alternate follow-ups with surgeon and oncologist annually.  Bone Health Discussed potential bone density loss with Aromatase Inhibitors. -Order baseline bone density scan, to be repeated every two years while on medication.  Breast Health Maintenance Discussed the need for continued mammograms. -Continue with annual diagnostic mammograms for up to five years, then revert to screening mammograms.

## 2023-06-26 ENCOUNTER — Other Ambulatory Visit: Payer: Self-pay

## 2023-06-26 ENCOUNTER — Ambulatory Visit
Admission: RE | Admit: 2023-06-26 | Discharge: 2023-06-26 | Disposition: A | Discharge status: Home / Self Care | Payer: 59 | Source: Ambulatory Visit | Attending: Radiation Oncology | Admitting: Radiation Oncology

## 2023-06-26 DIAGNOSIS — C50511 Malignant neoplasm of lower-outer quadrant of right female breast: Secondary | ICD-10-CM | POA: Diagnosis not present

## 2023-06-26 DIAGNOSIS — Z510 Encounter for antineoplastic radiation therapy: Secondary | ICD-10-CM | POA: Diagnosis not present

## 2023-06-26 DIAGNOSIS — Z170 Estrogen receptor positive status [ER+]: Secondary | ICD-10-CM | POA: Diagnosis not present

## 2023-06-26 DIAGNOSIS — Z923 Personal history of irradiation: Secondary | ICD-10-CM | POA: Diagnosis not present

## 2023-06-26 DIAGNOSIS — Z79811 Long term (current) use of aromatase inhibitors: Secondary | ICD-10-CM | POA: Diagnosis not present

## 2023-06-26 LAB — RAD ONC ARIA SESSION SUMMARY
Course Elapsed Days: 22
Plan Fractions Treated to Date: 1
Plan Prescribed Dose Per Fraction: 2.5 Gy
Plan Total Fractions Prescribed: 4
Plan Total Prescribed Dose: 10 Gy
Reference Point Dosage Given to Date: 2.5 Gy
Reference Point Session Dosage Given: 2.5 Gy
Session Number: 17

## 2023-06-27 ENCOUNTER — Ambulatory Visit
Admission: RE | Admit: 2023-06-27 | Discharge: 2023-06-27 | Disposition: A | Discharge status: Home / Self Care | Payer: 59 | Source: Ambulatory Visit | Attending: Radiation Oncology | Admitting: Radiation Oncology

## 2023-06-27 ENCOUNTER — Other Ambulatory Visit: Payer: Self-pay

## 2023-06-27 DIAGNOSIS — C50511 Malignant neoplasm of lower-outer quadrant of right female breast: Secondary | ICD-10-CM | POA: Diagnosis not present

## 2023-06-27 DIAGNOSIS — Z923 Personal history of irradiation: Secondary | ICD-10-CM | POA: Diagnosis not present

## 2023-06-27 DIAGNOSIS — Z170 Estrogen receptor positive status [ER+]: Secondary | ICD-10-CM | POA: Diagnosis not present

## 2023-06-27 DIAGNOSIS — Z79811 Long term (current) use of aromatase inhibitors: Secondary | ICD-10-CM | POA: Diagnosis not present

## 2023-06-27 LAB — RAD ONC ARIA SESSION SUMMARY
Course Elapsed Days: 23
Plan Fractions Treated to Date: 2
Plan Prescribed Dose Per Fraction: 2.5 Gy
Plan Total Fractions Prescribed: 4
Plan Total Prescribed Dose: 10 Gy
Reference Point Dosage Given to Date: 5 Gy
Reference Point Session Dosage Given: 2.5 Gy
Session Number: 18

## 2023-06-28 ENCOUNTER — Ambulatory Visit
Admission: RE | Admit: 2023-06-28 | Discharge: 2023-06-28 | Disposition: A | Discharge status: Home / Self Care | Payer: 59 | Source: Ambulatory Visit | Attending: Radiation Oncology | Admitting: Radiation Oncology

## 2023-06-28 ENCOUNTER — Telehealth: Payer: Self-pay | Admitting: Hematology and Oncology

## 2023-06-28 ENCOUNTER — Other Ambulatory Visit: Payer: Self-pay

## 2023-06-28 DIAGNOSIS — C50511 Malignant neoplasm of lower-outer quadrant of right female breast: Secondary | ICD-10-CM | POA: Diagnosis not present

## 2023-06-28 DIAGNOSIS — Z923 Personal history of irradiation: Secondary | ICD-10-CM | POA: Diagnosis not present

## 2023-06-28 DIAGNOSIS — Z170 Estrogen receptor positive status [ER+]: Secondary | ICD-10-CM | POA: Diagnosis not present

## 2023-06-28 DIAGNOSIS — Z79811 Long term (current) use of aromatase inhibitors: Secondary | ICD-10-CM | POA: Diagnosis not present

## 2023-06-28 LAB — RAD ONC ARIA SESSION SUMMARY
Course Elapsed Days: 24
Plan Fractions Treated to Date: 3
Plan Prescribed Dose Per Fraction: 2.5 Gy
Plan Total Fractions Prescribed: 4
Plan Total Prescribed Dose: 10 Gy
Reference Point Dosage Given to Date: 7.5 Gy
Reference Point Session Dosage Given: 2.5 Gy
Session Number: 19

## 2023-06-28 NOTE — Telephone Encounter (Signed)
Patient is aware of scheduled appointment times/dates per [place] 9/24 los

## 2023-07-01 ENCOUNTER — Ambulatory Visit
Admission: RE | Admit: 2023-07-01 | Discharge: 2023-07-01 | Disposition: A | Discharge status: Home / Self Care | Payer: 59 | Source: Ambulatory Visit | Attending: Radiation Oncology | Admitting: Radiation Oncology

## 2023-07-01 ENCOUNTER — Other Ambulatory Visit: Payer: Self-pay

## 2023-07-01 ENCOUNTER — Encounter: Payer: Self-pay | Admitting: Radiation Oncology

## 2023-07-01 DIAGNOSIS — Z510 Encounter for antineoplastic radiation therapy: Secondary | ICD-10-CM | POA: Diagnosis not present

## 2023-07-01 DIAGNOSIS — C50511 Malignant neoplasm of lower-outer quadrant of right female breast: Secondary | ICD-10-CM | POA: Diagnosis not present

## 2023-07-01 DIAGNOSIS — Z923 Personal history of irradiation: Secondary | ICD-10-CM | POA: Diagnosis not present

## 2023-07-01 DIAGNOSIS — Z79811 Long term (current) use of aromatase inhibitors: Secondary | ICD-10-CM | POA: Diagnosis not present

## 2023-07-01 DIAGNOSIS — Z170 Estrogen receptor positive status [ER+]: Secondary | ICD-10-CM | POA: Diagnosis not present

## 2023-07-01 LAB — RAD ONC ARIA SESSION SUMMARY
Course Elapsed Days: 27
Plan Fractions Treated to Date: 4
Plan Prescribed Dose Per Fraction: 2.5 Gy
Plan Total Fractions Prescribed: 4
Plan Total Prescribed Dose: 10 Gy
Reference Point Dosage Given to Date: 10 Gy
Reference Point Session Dosage Given: 2.5 Gy
Session Number: 20

## 2023-07-02 ENCOUNTER — Encounter: Payer: Self-pay | Admitting: Internal Medicine

## 2023-07-02 DIAGNOSIS — Z170 Estrogen receptor positive status [ER+]: Secondary | ICD-10-CM

## 2023-07-04 DIAGNOSIS — H5203 Hypermetropia, bilateral: Secondary | ICD-10-CM | POA: Diagnosis not present

## 2023-07-05 ENCOUNTER — Other Ambulatory Visit (HOSPITAL_COMMUNITY): Payer: Self-pay

## 2023-07-09 ENCOUNTER — Other Ambulatory Visit: Payer: Self-pay

## 2023-07-09 ENCOUNTER — Other Ambulatory Visit (HOSPITAL_COMMUNITY): Payer: Self-pay

## 2023-07-09 ENCOUNTER — Ambulatory Visit (INDEPENDENT_AMBULATORY_CARE_PROVIDER_SITE_OTHER)
Admission: RE | Admit: 2023-07-09 | Discharge: 2023-07-09 | Disposition: A | Discharge status: Home / Self Care | Payer: 59 | Source: Ambulatory Visit | Attending: Internal Medicine | Admitting: Internal Medicine

## 2023-07-09 DIAGNOSIS — Z170 Estrogen receptor positive status [ER+]: Secondary | ICD-10-CM

## 2023-07-09 DIAGNOSIS — C50511 Malignant neoplasm of lower-outer quadrant of right female breast: Secondary | ICD-10-CM | POA: Diagnosis not present

## 2023-07-09 NOTE — Progress Notes (Signed)
Updated DexaScan order

## 2023-07-12 ENCOUNTER — Encounter: Payer: Self-pay | Admitting: Hematology and Oncology

## 2023-07-14 NOTE — Progress Notes (Signed)
Radiation Oncology         [phone] ________________________________  Name: Abigail Foley MRN: [id number]  Date of Service: 07/01/2023  DOB: 1960-12-13  End of Treatment Note  Diagnosis: Stage IA, pT1cN0M0, grade 2, ER/PR positive invasive lobular carcinoma of the right breast.   Intent: Curative    ==========DELIVERED PLANS==========  First Treatment Date: 2023-06-04 Last Treatment Date: 2023-07-01  Plan Name: Breast_R Site: Breast, Right Technique: 3D Mode: Photon Dose Per Fraction: 2.66 Gy Prescribed Dose (Delivered / Prescribed): 42.56 Gy / 42.56 Gy Prescribed Fxs (Delivered / Prescribed): 16 / 16  Plan Name: Breast_R_Bst Site: Breast, Right Technique: 3D Mode: Photon Dose Per Fraction: 2.5 Gy Prescribed Dose (Delivered / Prescribed): 10 Gy / 10 Gy Prescribed Fxs (Delivered / Prescribed): 4 / 4    ==========ON TREATMENT VISIT DATES========== 2023-06-07, 2023-06-14, 2023-06-21, 2023-06-28  See weekly On Treatment Notes in Epic for details in the Media tab (listed as Progress notes on the On Treatment Visit Dates listed above).  The patient tolerated radiation. She developed fatigue and anticipated skin changes in the treatment field.   The patient will receive a call in about one month from the radiation oncology department. She will continue follow up with Dr. Al Pimple as well.

## 2023-07-23 ENCOUNTER — Other Ambulatory Visit (HOSPITAL_COMMUNITY)
Admission: RE | Admit: 2023-07-23 | Discharge: 2023-07-23 | Disposition: A | Discharge status: Home / Self Care | Payer: 59 | Source: Ambulatory Visit | Attending: Obstetrics and Gynecology | Admitting: Obstetrics and Gynecology

## 2023-07-23 ENCOUNTER — Encounter: Payer: Self-pay | Admitting: Obstetrics and Gynecology

## 2023-07-23 ENCOUNTER — Ambulatory Visit (INDEPENDENT_AMBULATORY_CARE_PROVIDER_SITE_OTHER): Payer: 59 | Admitting: Obstetrics and Gynecology

## 2023-07-23 VITALS — BP 110/68 | HR 86 | Ht 64.5 in | Wt 148.0 lb

## 2023-07-23 DIAGNOSIS — Z01419 Encounter for gynecological examination (general) (routine) without abnormal findings: Secondary | ICD-10-CM | POA: Insufficient documentation

## 2023-07-23 DIAGNOSIS — E041 Nontoxic single thyroid nodule: Secondary | ICD-10-CM

## 2023-07-23 NOTE — Progress Notes (Signed)
/    62 y.o. y.o. female here for annual exam. She denies any PM bleeding.   No LMP recorded. Patient is postmenopausal.   Pap: h/o cryo years ago Last colonoscopy: 7/24 repeat q 5 years for polyp seen Dexa 07/10/23 normal Breast cancer on anastrozole  Blood pressure 110/68, pulse 86, height 5' 4.5" (1.638 m), weight 148 lb (67.1 kg), SpO2 99%.  No results found for: "DIAGPAP", "HPVHIGH", "ADEQPAP"  GYN HISTORY: No results found for: "DIAGPAP", "HPVHIGH", "ADEQPAP"  OB History  Gravida Para Term Preterm AB Living  6 2 2   4 2   SAB IAB Ectopic Multiple Live Births  3   1        # Outcome Date GA Lbr Len/2nd Weight Sex Type Anes PTL Lv  6 Ectopic           5 SAB           4 SAB           3 SAB           2 Term           1 Term             Past Medical History:  Diagnosis Date   Abnormal Pap smear of cervix    yrs ago   Actinic keratosis    Allergy    Anemia    Anxiety    Asthma    Blood transfusion without reported diagnosis    breast cancer    Chlamydia    treated yrs ago   Depression    in the past   Gonorrhea    treated years ago   Hot flashes    Ocular migraine    STD (sexually transmitted disease)    HSV vaginal    Past Surgical History:  Procedure Laterality Date   ABLATION     BREAST BIOPSY Left 02/20/2023   MM LT BREAST BX W LOC DEV 1ST LESION IMAGE BX SPEC STEREO GUIDE 02/20/2023 [facility]   BREAST BIOPSY Right 02/20/2023   [place] RT BREAST BX W LOC DEV 1ST LESION IMG BX SPEC US GUIDE 02/20/2023 [facility]   BREAST LUMPECTOMY WITH SENTINEL LYMPH NODE BIOPSY Right 04/12/2023   Procedure: RIGHT BREAST LUMPECTOMY WITH SENTINEL LYMPH NODE BX;  Surgeon: Abigail Miyamoto, MD;  Location: [facility];  Service: General;  Laterality: Right;   CESAREAN SECTION     CRYOTHERAPY     for abnormal pap smear   DILATION AND CURETTAGE OF UTERUS     ECTOPIC PREGNANCY SURGERY     TUBAL LIGATION     WISDOM TOOTH EXTRACTION       Current Outpatient Medications on File Prior to Visit  Medication Sig Dispense Refill   anastrozole (ARIMIDEX) 1 MG tablet Take 1 tablet (1 mg total) by mouth daily. 90 tablet 3   atorvastatin (LIPITOR) 10 MG tablet Take 1 tablet (10 mg total) by mouth daily. 90 tablet 3   buPROPion (WELLBUTRIN XL) 150 MG 24 hr tablet Take 1 tablet (150 mg total) by mouth daily. 90 tablet 3   chlorhexidine (HIBICLENS) 4 % external liquid Apply topically daily as needed. 473 mL 3   loratadine (CLARITIN) 10 MG tablet Take 10 mg by mouth daily.     Multiple Vitamin (MULTIVITAMIN ADULT PO) Take by mouth.     valACYclovir (VALTREX) 500 MG tablet Take 1 tablet (500 mg) by mouth 2 times daily. 90 tablet 3  Zinc 50 MG TABS Take by mouth daily.     No current facility-administered medications on file prior to visit.    Social History   Socioeconomic History   Marital status: Married    Spouse name: Not on file   Number of children: Not on file   Years of education: Not on file   Highest education level: Not on file  Occupational History   Not on file  Tobacco Use   Smoking status: Some Days    Current packs/day: 0.00    Types: Cigarettes    Last attempt to quit: 10/01/2005    Years since quitting: 17.8   Smokeless tobacco: Never  Substance and Sexual Activity   Alcohol use: Yes    Comment: occasional   Drug use: No   Sexual activity: Yes    Partners: Male    Birth control/protection: Post-menopausal  Other Topics Concern   Not on file  Social History Narrative   Not on file   Social Determinants of Health   Financial Resource Strain: Not on file  Food Insecurity: No Food Insecurity (05/15/2023)   Hunger Vital Sign    Worried About Running Out of Food in the Last Year: Never true    Ran Out of Food in the Last Year: Never true  Transportation Needs: No Transportation Needs (03/06/2023)   PRAPARE - Administrator, Civil Service (Medical): No    Lack of Transportation  (Non-Medical): No  Physical Activity: Not on file  Stress: Not on file  Social Connections: Not on file  Intimate Partner Violence: Not At Risk (03/06/2023)   Humiliation, Afraid, Rape, and Kick questionnaire    Fear of Current or Ex-Partner: No    Emotionally Abused: No    Physically Abused: No    Sexually Abused: No    Family History  Problem Relation Age of Onset   Hyperlipidemia Mother    Diabetes Mother    Hypertension Mother    Stroke Father    Diabetes Father    Hyperlipidemia Father    Breast cancer Sister 7   Hyperlipidemia Sister    Skin cancer Sister    Hyperlipidemia Brother    Esophageal cancer Brother 48 - 35   Colon cancer Maternal Aunt 39 - 69   Liver cancer Maternal Uncle    Stomach cancer Maternal Uncle    Leukemia Paternal Uncle    Other Maternal Grandmother    Stroke Maternal Grandmother    Other Maternal Grandfather        pacemaker   Stroke Paternal Grandmother    Skin cancer Paternal Grandmother    Cancer Paternal Grandmother        vulvar cancer     Allergies  Allergen Reactions   Cefaclor Anaphylaxis    REACTION: anaphalaxis   Sulfa Antibiotics Hives      Patient's last menstrual period was No LMP recorded. Patient is postmenopausal..           Review of Systems Alls systems reviewed and are negative.     OBGyn Exam    A:         Well Woman GYN exam                             P:        Pap smear collected today Encouraged annual mammogram screening Colon cancer screening up-to-date DXA up-to-date Labs and immunizations to do with PMD  Discussed breast self exams Encouraged healthy lifestyle practices Encouraged Vit D and Calcium   No follow-ups on file.  Earley Favor

## 2023-07-25 ENCOUNTER — Encounter: Payer: Self-pay | Admitting: Obstetrics and Gynecology

## 2023-07-26 NOTE — Telephone Encounter (Signed)
A specific location was needed on the [place] order for the pt to be contacted by the location to schedule [place] appt.  [place] order has been addended. Will leave this encounter in triage pool for f/u.

## 2023-07-26 NOTE — Addendum Note (Signed)
Addended by: Jodelle Red D on: 07/26/2023 11:25 AM   Modules accepted: Orders

## 2023-07-29 LAB — CYTOLOGY - PAP
Comment: NEGATIVE
Diagnosis: NEGATIVE
High risk HPV: NEGATIVE

## 2023-07-29 NOTE — Telephone Encounter (Signed)
Thyroid US scheduled for 07/30/23.   Encounter closed.

## 2023-07-30 ENCOUNTER — Ambulatory Visit
Admission: RE | Admit: 2023-07-30 | Discharge: 2023-07-30 | Disposition: A | Discharge status: Home / Self Care | Payer: 59 | Source: Ambulatory Visit | Attending: Obstetrics and Gynecology | Admitting: Obstetrics and Gynecology

## 2023-07-30 DIAGNOSIS — Z01419 Encounter for gynecological examination (general) (routine) without abnormal findings: Secondary | ICD-10-CM

## 2023-07-30 DIAGNOSIS — R221 Localized swelling, mass and lump, neck: Secondary | ICD-10-CM | POA: Diagnosis not present

## 2023-08-12 ENCOUNTER — Encounter: Payer: Self-pay | Admitting: Obstetrics and Gynecology

## 2023-08-12 ENCOUNTER — Encounter: Payer: Self-pay | Admitting: Internal Medicine

## 2023-08-12 DIAGNOSIS — Z1321 Encounter for screening for nutritional disorder: Secondary | ICD-10-CM

## 2023-08-12 DIAGNOSIS — E041 Nontoxic single thyroid nodule: Secondary | ICD-10-CM

## 2023-08-14 ENCOUNTER — Other Ambulatory Visit: Payer: 59

## 2023-08-14 DIAGNOSIS — E041 Nontoxic single thyroid nodule: Secondary | ICD-10-CM

## 2023-08-14 DIAGNOSIS — Z1321 Encounter for screening for nutritional disorder: Secondary | ICD-10-CM

## 2023-08-15 LAB — VITAMIN D 25 HYDROXY (VIT D DEFICIENCY, FRACTURES): Vit D, 25-Hydroxy: 51 ng/mL (ref 30–100)

## 2023-08-15 LAB — TSH+FREE T4: TSH W/REFLEX TO FT4: 1.63 m[IU]/L (ref 0.40–4.50)

## 2023-09-23 ENCOUNTER — Ambulatory Visit
Admission: RE | Admit: 2023-09-23 | Discharge: 2023-09-23 | Disposition: A | Discharge status: Home / Self Care | Payer: 59 | Source: Ambulatory Visit | Attending: Radiation Oncology | Admitting: Radiation Oncology

## 2023-09-23 NOTE — Progress Notes (Signed)
  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 (as documented in provider EOT note)  The patient was not available for call today. Voicemail left.  The patient was encouraged to avoid sun exposure in the area of prior treatment for up to one year following radiation with either sunscreen or by the style of clothing worn in the sun.  The patient has scheduled follow up with her medical oncologist Dr. Al Pimple for ongoing surveillance, and was encouraged to call if she develops concerns or questions regarding radiation.    Ruel Favors, LPN

## 2023-10-25 ENCOUNTER — Ambulatory Visit: Payer: 59 | Admitting: Hematology and Oncology

## 2023-10-25 ENCOUNTER — Encounter: Payer: Self-pay | Admitting: Adult Health

## 2023-10-25 ENCOUNTER — Inpatient Hospital Stay: Payer: 59 | Attending: Adult Health | Admitting: Adult Health

## 2023-10-25 VITALS — BP 115/70 | HR 84 | Temp 97.6°F | Resp 18 | Ht 64.5 in | Wt 151.0 lb

## 2023-10-25 DIAGNOSIS — Z803 Family history of malignant neoplasm of breast: Secondary | ICD-10-CM | POA: Diagnosis not present

## 2023-10-25 DIAGNOSIS — N951 Menopausal and female climacteric states: Secondary | ICD-10-CM | POA: Diagnosis not present

## 2023-10-25 DIAGNOSIS — Z79899 Other long term (current) drug therapy: Secondary | ICD-10-CM | POA: Diagnosis not present

## 2023-10-25 DIAGNOSIS — Z808 Family history of malignant neoplasm of other organs or systems: Secondary | ICD-10-CM | POA: Diagnosis not present

## 2023-10-25 DIAGNOSIS — Z170 Estrogen receptor positive status [ER+]: Secondary | ICD-10-CM | POA: Diagnosis not present

## 2023-10-25 DIAGNOSIS — Z79811 Long term (current) use of aromatase inhibitors: Secondary | ICD-10-CM | POA: Insufficient documentation

## 2023-10-25 DIAGNOSIS — C50511 Malignant neoplasm of lower-outer quadrant of right female breast: Secondary | ICD-10-CM | POA: Insufficient documentation

## 2023-10-25 DIAGNOSIS — Z806 Family history of leukemia: Secondary | ICD-10-CM | POA: Diagnosis not present

## 2023-10-25 DIAGNOSIS — Z800 Family history of malignant neoplasm of digestive organs: Secondary | ICD-10-CM | POA: Insufficient documentation

## 2023-10-25 NOTE — Progress Notes (Unsigned)
SURVIVORSHIP VISIT:  BRIEF ONCOLOGIC HISTORY:  Oncology History  Malignant neoplasm of lower-outer quadrant of right breast of female, estrogen receptor positive (HCC)  02/12/2023 Mammogram   Diagnostic mammogram showed suspicious right breast mass corresponding with patient's palpable lump, recommend ultrasound-guided biopsy.  Indeterminate left breast calcs, recommend stereotactic biopsy.  No suspicious right axillary lymphadenopathy.   02/20/2023 Pathology Results   Right breast needle core biopsy showed overall grade 2 invasive mammary carcinoma, consistent with lobular phenotype, left breast needle core biopsy in the upper outer breast showed Fibrocystic changes with calcs, negative for carcinoma.  Prognostics from the invasive lobular carcinoma showed ER 90% positive strong staining PR 5% positive strong staining Ki-67 of 3% HER2 1+   03/05/2023 Initial Diagnosis   Malignant neoplasm of lower-outer quadrant of right breast of female, estrogen receptor positive (HCC)   03/06/2023 Cancer Staging   Staging form: Breast, AJCC 8th Edition - Clinical stage from 03/06/2023: Stage IA (cT1c, cN0, cM0, G2, ER+, PR+, HER2-) - Signed by Ronny Bacon, PA-C on 03/06/2023 Stage prefix: Initial diagnosis Histologic grading system: 3 grade system    Genetic Testing   [facility] Common Cancer Panel+RNA was Negative. Report date is 03/12/2023.  The Common Hereditary Cancers Panel offered by [facility] includes sequencing and/or deletion duplication testing of the following 48 genes: APC, ATM, AXIN2, BAP1, BARD1, BMPR1A, BRCA1, BRCA2, BRIP1, CDH1, CDK4, CDKN2A (p14ARF and p16INK4a only), CHEK2, CTNNA1, DICER1, EPCAM (Deletion/duplication testing only), FH, GREM1 (promoter region duplication testing only), HOXB13, KIT, MBD4, MEN1, MLH1, MSH2, MSH3, MSH6, MUTYH, NF1, NHTL1, PALB2, PDGFRA, PMS2, POLD1, POLE, PTEN, RAD51C, RAD51D, SDHA (sequencing analysis only except exon 14), SDHB, SDHC, SDHD, SMAD4, SMARCA4. STK11,  TP53, TSC1, TSC2, and VHL.   04/12/2023 Definitive Surgery   Since her last visit she had right breast lumpectomy and this showed grade 2 1.4 cm measuring invasive lobular carcinoma, posterior margin focally positive for invasive carcinoma however no further surgery planned since posterior margin is the pectoralis major muscle.  Negative for lymphovascular or perineural invasion right axillary sentinel lymph node was negative for carcinoma.  Prior prognostic showed ER 90% positive strong staining PR 5% positive strong staining, HER2 negative 1+   06/04/2023 - 07/01/2023 Radiation Therapy   Plan Name: Breast_R Site: Breast, Right Technique: 3D Mode: Photon Dose Per Fraction: 2.66 Gy Prescribed Dose (Delivered / Prescribed): 42.56 Gy / 42.56 Gy Prescribed Fxs (Delivered / Prescribed): 16 / 16   Plan Name: Breast_R_Bst Site: Breast, Right Technique: 3D Mode: Photon Dose Per Fraction: 2.5 Gy Prescribed Dose (Delivered / Prescribed): 10 Gy / 10 Gy Prescribed Fxs (Delivered / Prescribed): 4 / 4   07/2023 -  Anti-estrogen oral therapy   Anastrozole     INTERVAL HISTORY:  [name], with a history of stage 1A right-sided breast cancer, presents with hot flashes and mood changes since starting anastrozole in October. She reports daily hot flashes initially, which have since decreased in frequency. She also notes increased moodiness and crying more easily, which she attributes to the medication. She also reports increased fatigue, which she speculates could be due to the winter season.  She feels like these issues are improving.  The patient also reports occasional sharp pains at the lumpectomy site and a feeling of tightness in the muscle when moving in certain ways. She also mentions a history of smoking on and off for approximately 40 years, currently socially smoking.  REVIEW OF SYSTEMS:  Review of Systems  Constitutional:  Positive for fatigue. Negative for appetite  change, chills, fever and  unexpected weight change.  HENT:   Negative for hearing loss, lump/mass and trouble swallowing.   Eyes:  Negative for eye problems and icterus.  Respiratory:  Negative for chest tightness, cough and shortness of breath.   Cardiovascular:  Negative for chest pain, leg swelling and palpitations.  Gastrointestinal:  Negative for abdominal distention, abdominal pain, constipation, diarrhea, nausea and vomiting.  Endocrine: Positive for hot flashes.  Genitourinary:  Negative for difficulty urinating.   Musculoskeletal:  Negative for arthralgias.  Skin:  Negative for itching and rash.  Neurological:  Negative for dizziness, extremity weakness, headaches and numbness.  Hematological:  Negative for adenopathy. Does not bruise/bleed easily.  Psychiatric/Behavioral:  Negative for depression. The patient is not nervous/anxious.   Breast: Denies any new nodularity, masses, tenderness, nipple changes, or nipple discharge.    PAST MEDICAL/SURGICAL HISTORY:  Past Medical History:  Diagnosis Date   Abnormal Pap smear of cervix    yrs ago   Actinic keratosis    Allergy    Anemia    Anxiety    Asthma    Blood transfusion without reported diagnosis    breast cancer    Chlamydia    treated yrs ago   Depression    in the past   Gonorrhea    treated years ago   Hot flashes    Ocular migraine    STD (sexually transmitted disease)    HSV vaginal   Past Surgical History:  Procedure Laterality Date   ABLATION     BREAST BIOPSY Left 02/20/2023   MM LT BREAST BX W LOC DEV 1ST LESION IMAGE BX SPEC STEREO GUIDE 02/20/2023 [facility]   BREAST BIOPSY Right 02/20/2023   [place] RT BREAST BX W LOC DEV 1ST LESION IMG BX SPEC US GUIDE 02/20/2023 [facility]   BREAST LUMPECTOMY WITH SENTINEL LYMPH NODE BIOPSY Right 04/12/2023   Procedure: RIGHT BREAST LUMPECTOMY WITH SENTINEL LYMPH NODE BX;  Surgeon: Abigail Miyamoto, MD;  Location: [facility];  Service: General;  Laterality: Right;    CESAREAN SECTION     CRYOTHERAPY     for abnormal pap smear   DILATION AND CURETTAGE OF UTERUS     ECTOPIC PREGNANCY SURGERY     TUBAL LIGATION     WISDOM TOOTH EXTRACTION       ALLERGIES:  Allergies  Allergen Reactions   Cefaclor Anaphylaxis    REACTION: anaphalaxis   Sulfa Antibiotics Hives     CURRENT MEDICATIONS:  Outpatient Encounter Medications as of 10/25/2023  Medication Sig   anastrozole (ARIMIDEX) 1 MG tablet Take 1 tablet (1 mg total) by mouth daily.   atorvastatin (LIPITOR) 10 MG tablet Take 1 tablet (10 mg total) by mouth daily.   buPROPion (WELLBUTRIN XL) 150 MG 24 hr tablet Take 1 tablet (150 mg total) by mouth daily.   loratadine (CLARITIN) 10 MG tablet Take 10 mg by mouth daily.   Multiple Vitamin (MULTIVITAMIN ADULT PO) Take by mouth.   valACYclovir (VALTREX) 500 MG tablet Take 1 tablet (500 mg) by mouth 2 times daily.   Zinc 50 MG TABS Take by mouth daily.   chlorhexidine (HIBICLENS) 4 % external liquid Apply topically daily as needed. (Patient not taking: Reported on 10/25/2023)   No facility-administered encounter medications on file as of 10/25/2023.     ONCOLOGIC FAMILY HISTORY:  Family History  Problem Relation Age of Onset   Hyperlipidemia Mother    Diabetes Mother    Hypertension Mother  Stroke Father    Diabetes Father    Hyperlipidemia Father    Breast cancer Sister 63   Hyperlipidemia Sister    Skin cancer Sister    Hyperlipidemia Brother    Esophageal cancer Brother 26 - 42   Colon cancer Maternal Aunt 10 - 69   Liver cancer Maternal Uncle    Stomach cancer Maternal Uncle    Leukemia Paternal Uncle    Other Maternal Grandmother    Stroke Maternal Grandmother    Other Maternal Grandfather        pacemaker   Stroke Paternal Grandmother    Skin cancer Paternal Grandmother    Cancer Paternal Grandmother        vulvar cancer     SOCIAL HISTORY:  Social History   Socioeconomic History   Marital status: Married    Spouse  name: Not on file   Number of children: Not on file   Years of education: Not on file   Highest education level: Not on file  Occupational History   Not on file  Tobacco Use   Smoking status: Some Days    Current packs/day: 0.00    Types: Cigarettes    Last attempt to quit: 10/01/2005    Years since quitting: 18.0   Smokeless tobacco: Never  Substance and Sexual Activity   Alcohol use: Yes    Comment: occasional   Drug use: No   Sexual activity: Yes    Partners: Male    Birth control/protection: Post-menopausal  Other Topics Concern   Not on file  Social History Narrative   Not on file   Social Drivers of Health   Financial Resource Strain: Not on file  Food Insecurity: No Food Insecurity (05/15/2023)   Hunger Vital Sign    Worried About Running Out of Food in the Last Year: Never true    Ran Out of Food in the Last Year: Never true  Transportation Needs: No Transportation Needs (03/06/2023)   PRAPARE - Administrator, Civil Service (Medical): No    Lack of Transportation (Non-Medical): No  Physical Activity: Not on file  Stress: Not on file  Social Connections: Not on file  Intimate Partner Violence: Not At Risk (03/06/2023)   Humiliation, Afraid, Rape, and Kick questionnaire    Fear of Current or Ex-Partner: No    Emotionally Abused: No    Physically Abused: No    Sexually Abused: No     OBSERVATIONS/OBJECTIVE:  BP 115/70 (BP Location: Left Arm, Patient Position: Sitting)   Pulse 84   Temp 97.6 F (36.4 C) (Tympanic)   Resp 18   Ht 5' 4.5" (1.638 m)   Wt 151 lb (68.5 kg)   SpO2 98%   BMI 25.52 kg/m  GENERAL: Patient is a well appearing female in no acute distress HEENT:  Sclerae anicteric.  Oropharynx clear and moist. No ulcerations or evidence of oropharyngeal candidiasis. Neck is supple.  NODES:  No cervical, supraclavicular, or axillary lymphadenopathy palpated.  BREAST EXAM: Right breast status postlumpectomy and radiation no sign of local  recurrence left breast is benign. LUNGS:  Clear to auscultation bilaterally.  No wheezes or rhonchi. HEART:  Regular rate and rhythm. No murmur appreciated. ABDOMEN:  Soft, nontender.  Positive, normoactive bowel sounds. No organomegaly palpated. MSK:  No focal spinal tenderness to palpation. Full range of motion bilaterally in the upper extremities. EXTREMITIES:  No peripheral edema.   SKIN:  Clear with no obvious rashes or skin changes. No  nail dyscrasia. NEURO:  Nonfocal. Well oriented.  Appropriate affect.   LABORATORY DATA:  None for this visit.  DIAGNOSTIC IMAGING:  None for this visit.      ASSESSMENT AND PLAN:  Ms.. Abigail Foley is a pleasant 63 y.o. female with Stage 1A right breast invasive ductal carcinoma, ER+/PR+/HER2-, diagnosed in 01/2023, treated with lumpectomy, adjuvant radiation therapy, and anti-estrogen therapy with anastrozole beginning in 07/2023.  She presents to the Survivorship Clinic for our initial meeting and routine follow-up post-completion of treatment for breast cancer.    1. Stage 1A right breast cancer:  Ms. Sexson is continuing to recover from definitive treatment for breast cancer. She will follow-up with her medical oncologist, Dr.  Al Pimple in 6 months with history and physical exam per surveillance protocol.  She will continue her anti-estrogen therapy with Anastrozole. Thus far, she is tolerating the Anastrozole moderately well, and if her side effects increase, she knows to reach out so we can discuss adjustments in her medication. Her mammogram is due 01/2024; orders placed today.   Today, a comprehensive survivorship care plan and treatment summary was reviewed with the patient today detailing her breast cancer diagnosis, treatment course, potential late/long-term effects of treatment, appropriate follow-up care with recommendations for the future, and patient education resources.  A copy of this summary, along with a letter will be sent to the patient's  primary care provider via mail/fax/In Basket message after today's visit.    2. Bone health:  Given Ms. Peppard's age/history of breast cancer and her current treatment regimen including anti-estrogen therapy with Anastrozole, she is at risk for bone demineralization.  Her last DEXA scan was 07/2023 was normal. Repeat is recommended in 07/2025.  She was given education on specific activities to promote bone health.  3. Cancer screening:  Due to Ms. Fargnoli's history and her age, she should receive screening for skin cancers, colon cancer, and gynecologic cancers.  The information and recommendations are listed on the patient's comprehensive care plan/treatment summary and were reviewed in detail with the patient.  Also eligible for lung cancer screening and presents to talk to her primary care provider about this.  We discussed smoking cessation as well today.  4. Health maintenance and wellness promotion: Ms. Sundby was encouraged to consume 5-7 servings of fruits and vegetables per day. We reviewed the "Nutrition Rainbow" handout.  She was also encouraged to engage in moderate to vigorous exercise for 30 minutes per day most days of the week.  She was instructed to limit her alcohol consumption and recommended to stop smoking.   5. Support services/counseling: It is not uncommon for this period of the patient's cancer care trajectory to be one of many emotions and stressors.   She was given information regarding our available services and encouraged to contact me with any questions or for help enrolling in any of our support group/programs.    Follow up instructions:    -Return to cancer center 03/2024 for follow-up with Dr. Al Pimple -Mammogram due in 01/2024 -Bone density testing due in October 2026 -She is welcome to return back to the Survivorship Clinic at any time; no additional follow-up needed at this time.  -Consider referral back to survivorship as a long-term survivor for continued  surveillance  The patient was provided an opportunity to ask questions and all were answered. The patient agreed with the plan and demonstrated an understanding of the instructions.   Total encounter time: 30 minutes*in face-to-face visit time, chart review, lab review, care coordination,  order entry, and documentation of the encounter time.    Lillard Anes, NP 10/25/23 10:30 AM Medical Oncology and Hematology [facility] [address] [place], [place] [address] Tel. [phone]    Fax. [phone]  *Total Encounter Time as defined by the Centers for Medicare and Medicaid Services includes, in addition to the face-to-face time of a patient visit (documented in the note above) non-face-to-face time: obtaining and reviewing outside history, ordering and reviewing medications, tests or procedures, care coordination (communications with other health care professionals or caregivers) and documentation in the medical record.

## 2024-02-19 ENCOUNTER — Ambulatory Visit
Admission: RE | Admit: 2024-02-19 | Discharge: 2024-02-19 | Disposition: A | Discharge status: Home / Self Care | Payer: 59 | Source: Ambulatory Visit | Attending: Adult Health | Admitting: Adult Health

## 2024-02-19 DIAGNOSIS — C50511 Malignant neoplasm of lower-outer quadrant of right female breast: Secondary | ICD-10-CM

## 2024-02-19 DIAGNOSIS — Z853 Personal history of malignant neoplasm of breast: Secondary | ICD-10-CM | POA: Diagnosis not present

## 2024-02-19 DIAGNOSIS — Z08 Encounter for follow-up examination after completed treatment for malignant neoplasm: Secondary | ICD-10-CM | POA: Diagnosis not present

## 2024-02-21 ENCOUNTER — Ambulatory Visit: Payer: Self-pay | Admitting: Internal Medicine

## 2024-02-21 ENCOUNTER — Other Ambulatory Visit: Payer: Self-pay

## 2024-04-28 ENCOUNTER — Telehealth: Payer: Self-pay

## 2024-04-28 NOTE — Telephone Encounter (Signed)
 Pt verbally confirmed appt for 7/30

## 2024-04-29 ENCOUNTER — Inpatient Hospital Stay: Payer: 59 | Attending: Hematology and Oncology | Admitting: Hematology and Oncology

## 2024-04-29 ENCOUNTER — Other Ambulatory Visit (HOSPITAL_COMMUNITY): Payer: Self-pay

## 2024-04-29 DIAGNOSIS — Z8049 Family history of malignant neoplasm of other genital organs: Secondary | ICD-10-CM | POA: Diagnosis not present

## 2024-04-29 DIAGNOSIS — C50511 Malignant neoplasm of lower-outer quadrant of right female breast: Secondary | ICD-10-CM | POA: Diagnosis not present

## 2024-04-29 DIAGNOSIS — Z808 Family history of malignant neoplasm of other organs or systems: Secondary | ICD-10-CM | POA: Insufficient documentation

## 2024-04-29 DIAGNOSIS — Z79811 Long term (current) use of aromatase inhibitors: Secondary | ICD-10-CM | POA: Insufficient documentation

## 2024-04-29 DIAGNOSIS — N641 Fat necrosis of breast: Secondary | ICD-10-CM | POA: Insufficient documentation

## 2024-04-29 DIAGNOSIS — Z170 Estrogen receptor positive status [ER+]: Secondary | ICD-10-CM | POA: Diagnosis not present

## 2024-04-29 DIAGNOSIS — N952 Postmenopausal atrophic vaginitis: Secondary | ICD-10-CM | POA: Insufficient documentation

## 2024-04-29 DIAGNOSIS — N951 Menopausal and female climacteric states: Secondary | ICD-10-CM | POA: Insufficient documentation

## 2024-04-29 DIAGNOSIS — Z800 Family history of malignant neoplasm of digestive organs: Secondary | ICD-10-CM | POA: Diagnosis not present

## 2024-04-29 DIAGNOSIS — Z806 Family history of leukemia: Secondary | ICD-10-CM | POA: Insufficient documentation

## 2024-04-29 DIAGNOSIS — I959 Hypotension, unspecified: Secondary | ICD-10-CM | POA: Diagnosis not present

## 2024-04-29 MED ORDER — ESTRADIOL 0.1 MG/GM VA CREA
1.0000 | TOPICAL_CREAM | VAGINAL | 1 refills | Status: AC
  Filled 2024-04-29 – 2024-05-06 (×2): qty 42.5, 30d supply, fill #0
  Filled 2024-06-01: qty 42.5, 30d supply, fill #1

## 2024-04-29 NOTE — Progress Notes (Signed)
 BRIEF ONCOLOGIC HISTORY:  Oncology History  Malignant neoplasm of lower-outer quadrant of right breast of female, estrogen receptor positive (HCC)  02/12/2023 Mammogram   Diagnostic mammogram showed suspicious right breast mass corresponding with patient's palpable lump, recommend ultrasound-guided biopsy.  Indeterminate left breast calcs, recommend stereotactic biopsy.  No suspicious right axillary lymphadenopathy.   02/20/2023 Pathology Results   Right breast needle core biopsy showed overall grade 2 invasive mammary carcinoma, consistent with lobular phenotype, left breast needle core biopsy in the upper outer breast showed Fibrocystic changes with calcs, negative for carcinoma.  Prognostics from the invasive lobular carcinoma showed ER 90% positive strong staining PR 5% positive strong staining Ki-67 of 3% HER2 1+   03/05/2023 Initial Diagnosis   Malignant neoplasm of lower-outer quadrant of right breast of female, estrogen receptor positive (HCC)   03/06/2023 Cancer Staging   Staging form: Breast, AJCC 8th Edition - Clinical stage from 03/06/2023: Stage IA (cT1c, cN0, cM0, G2, ER+, PR+, HER2-) - Signed by Lanell Donald Stagger, PA-C on 03/06/2023 Stage prefix: Initial diagnosis Histologic grading system: 3 grade system    Genetic Testing   [facility] Common Cancer Panel+RNA was Negative. Report date is 03/12/2023.  The Common Hereditary Cancers Panel offered by [facility] includes sequencing and/or deletion duplication testing of the following 48 genes: APC, ATM, AXIN2, BAP1, BARD1, BMPR1A, BRCA1, BRCA2, BRIP1, CDH1, CDK4, CDKN2A (p14ARF and p16INK4a only), CHEK2, CTNNA1, DICER1, EPCAM (Deletion/duplication testing only), FH, GREM1 (promoter region duplication testing only), HOXB13, KIT, MBD4, MEN1, MLH1, MSH2, MSH3, MSH6, MUTYH, NF1, NHTL1, PALB2, PDGFRA, PMS2, POLD1, POLE, PTEN, RAD51C, RAD51D, SDHA (sequencing analysis only except exon 14), SDHB, SDHC, SDHD, SMAD4, SMARCA4. STK11, TP53, TSC1, TSC2,  and VHL.   04/12/2023 Definitive Surgery   Since her last visit she had right breast lumpectomy and this showed grade 2 1.4 cm measuring invasive lobular carcinoma, posterior margin focally positive for invasive carcinoma however no further surgery planned since posterior margin is the pectoralis major muscle.  Negative for lymphovascular or perineural invasion right axillary sentinel lymph node was negative for carcinoma.  Prior prognostic showed ER 90% positive strong staining PR 5% positive strong staining, HER2 negative 1+   04/12/2023 Oncotype testing   10/3%   04/12/2023 Cancer Staging   Staging form: Breast, AJCC 8th Edition - Pathologic stage from 04/12/2023: Stage IA (pT1c, pN0, cM0, G2, ER+, PR+, HER2-) - Signed by Crawford Morna Pickle, NP on 10/25/2023 Histologic grading system: 3 grade system   06/04/2023 - 07/01/2023 Radiation Therapy   Plan Name: Breast_R Site: Breast, Right Technique: 3D Mode: Photon Dose Per Fraction: 2.66 Gy Prescribed Dose (Delivered / Prescribed): 42.56 Gy / 42.56 Gy Prescribed Fxs (Delivered / Prescribed): 16 / 16   Plan Name: Breast_R_Bst Site: Breast, Right Technique: 3D Mode: Photon Dose Per Fraction: 2.5 Gy Prescribed Dose (Delivered / Prescribed): 10 Gy / 10 Gy Prescribed Fxs (Delivered / Prescribed): 4 / 4   07/2023 -  Anti-estrogen oral therapy   Anastrozole      INTERVAL HISTORY:  Abigail Foley, with a history of stage 1A right-sided breast cancer, presents with hot flashes and mood changes since starting anastrozole  in October.   Discussed the use of AI scribe software for clinical note transcription with the patient, who gave verbal consent to proceed.  History of Present Illness Abigail Foley is a 63 year old female with breast cancer who presents for follow-up regarding medication side effects.  She experiences side effects from anastrozole , including hot flashes and significant vaginal  dryness, which has led to bleeding despite  the use of lubricants. No mood swings are noted. The hot flashes are not intolerable.  She has tenderness in the breast area, possibly due to scar tissue or muscular issues, but denies any lumps. She reports having had a recent mammogram and was told she has dense breast tissue. She reports having had a bone density scan, and recalls being told that her bones are generally healthy, though the femoral neck on the left side was borderline for osteopenia.  Her blood pressure tends to run low, with a recent reading of 107/54, but she feels fine and denies symptoms such as a cold or sore throat.  She is currently taking a multivitamin and a super B complex. She has not been taking vitamin D  recently. She has not seen her breast surgeon since her surgery.  Rest of the pertinent 10 point ros reviewed and neg.   PAST MEDICAL/SURGICAL HISTORY:  Past Medical History:  Diagnosis Date   Abnormal Pap smear of cervix    yrs ago   Actinic keratosis    Allergy    Anemia    Anxiety    Asthma    Blood transfusion without reported diagnosis    breast cancer    Chlamydia    treated yrs ago   Depression    in the past   Gonorrhea    treated years ago   Hot flashes    Ocular migraine    STD (sexually transmitted disease)    HSV vaginal   Past Surgical History:  Procedure Laterality Date   ABLATION     BREAST BIOPSY Left 02/20/2023   MM LT BREAST BX W LOC DEV 1ST LESION IMAGE BX SPEC STEREO GUIDE 02/20/2023 [facility]   BREAST BIOPSY Right 02/20/2023   US  RT BREAST BX W LOC DEV 1ST LESION IMG BX SPEC US  GUIDE 02/20/2023 [facility]   BREAST LUMPECTOMY WITH SENTINEL LYMPH NODE BIOPSY Right 04/12/2023   Procedure: RIGHT BREAST LUMPECTOMY WITH SENTINEL LYMPH NODE BX;  Surgeon: Vernetta Berg, MD;  Location: [facility];  Service: General;  Laterality: Right;   CESAREAN SECTION     CRYOTHERAPY     for abnormal pap smear   DILATION AND CURETTAGE OF UTERUS     ECTOPIC  PREGNANCY SURGERY     TUBAL LIGATION     WISDOM TOOTH EXTRACTION       ALLERGIES:  Allergies  Allergen Reactions   Cefaclor Anaphylaxis    REACTION: anaphalaxis   Sulfa Antibiotics Hives     CURRENT MEDICATIONS:  Outpatient Encounter Medications as of 04/29/2024  Medication Sig   anastrozole  (ARIMIDEX ) 1 MG tablet Take 1 tablet (1 mg total) by mouth daily.   atorvastatin  (LIPITOR) 10 MG tablet Take 1 tablet (10 mg total) by mouth daily.   buPROPion  (WELLBUTRIN  XL) 150 MG 24 hr tablet Take 1 tablet (150 mg total) by mouth daily.   chlorhexidine  (HIBICLENS ) 4 % external liquid Apply topically daily as needed.   estradiol  (ESTRACE  VAGINAL) 0.1 MG/GM vaginal cream Place 1 Applicatorful vaginally once a week.   loratadine (CLARITIN) 10 MG tablet Take 10 mg by mouth daily.   Multiple Vitamin (MULTIVITAMIN ADULT PO) Take by mouth.   valACYclovir  (VALTREX ) 500 MG tablet Take 1 tablet (500 mg) by mouth 2 times daily.   Zinc 50 MG TABS Take by mouth daily.   No facility-administered encounter medications on file as of 04/29/2024.     ONCOLOGIC FAMILY  HISTORY:  Family History  Problem Relation Age of Onset   Hyperlipidemia Mother    Diabetes Mother    Hypertension Mother    Stroke Father    Diabetes Father    Hyperlipidemia Father    Breast cancer Sister 13   Hyperlipidemia Sister    Skin cancer Sister    Hyperlipidemia Brother    Esophageal cancer Brother 25 - 77   Colon cancer Maternal Aunt 17 - 69   Liver cancer Maternal Uncle    Stomach cancer Maternal Uncle    Leukemia Paternal Uncle    Other Maternal Grandmother    Stroke Maternal Grandmother    Other Maternal Grandfather        pacemaker   Stroke Paternal Grandmother    Skin cancer Paternal Grandmother    Cancer Paternal Grandmother        vulvar cancer     SOCIAL HISTORY:  Social History   Socioeconomic History   Marital status: Married    Spouse name: Not on file   Number of children: Not on file    Years of education: Not on file   Highest education level: Not on file  Occupational History   Not on file  Tobacco Use   Smoking status: Some Days    Current packs/day: 0.00    Types: Cigarettes    Last attempt to quit: 10/01/2005    Years since quitting: 18.5   Smokeless tobacco: Never  Substance and Sexual Activity   Alcohol use: Yes    Comment: occasional   Drug use: No   Sexual activity: Yes    Partners: Male    Birth control/protection: Post-menopausal  Other Topics Concern   Not on file  Social History Narrative   Not on file   Social Drivers of Health   Financial Resource Strain: Not on file  Food Insecurity: No Food Insecurity (05/15/2023)   Hunger Vital Sign    Worried About Running Out of Food in the Last Year: Never true    Ran Out of Food in the Last Year: Never true  Transportation Needs: No Transportation Needs (03/06/2023)   PRAPARE - Administrator, Civil Service (Medical): No    Lack of Transportation (Non-Medical): No  Physical Activity: Not on file  Stress: Not on file  Social Connections: Not on file  Intimate Partner Violence: Not At Risk (03/06/2023)   Humiliation, Afraid, Rape, and Kick questionnaire    Fear of Current or Ex-Partner: No    Emotionally Abused: No    Physically Abused: No    Sexually Abused: No     OBSERVATIONS/OBJECTIVE:   There were no vitals taken for this visit.  GENERAL: Patient is a well appearing female in no acute distress Bilateral breasts palpated. No palpable masses. No regional adenopathy.   LABORATORY DATA:  None for this visit.  DIAGNOSTIC IMAGING:  None for this visit.      ASSESSMENT AND PLAN:  Abigail Foley is a pleasant 63 y.o. female with Stage 1A right breast invasive ductal carcinoma, ER+/PR+/HER2-, diagnosed in 01/2023, treated with lumpectomy, adjuvant radiation therapy, and anti-estrogen therapy with anastrozole  beginning in 07/2023.    Assessment and Plan Assessment & Plan Breast  cancer, status post surgery, on anastrozole  Status post breast cancer surgery, on anastrozole . No new issues from anastrozole  except hot flashes and vaginal dryness. Mammogram shows dense breast tissue. Bone density scan shows normal bone density. - Continue anastrozole  therapy. - Continue calcium  and vitamin D  supplementation.  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 Previous lubricants caused bleeding. Discussed lubricants, coconut oil suppositories, replant wipes, pelvic rehabilitation, and vaginal estrogen. Vaginal estrogen considered safe due to minimal systemic absorption. - Prescribe vaginal estrogen twice a week. - Consider pelvic rehabilitation if symptoms persist.  Hot flashes related to anastrozole  therapy Hot flashes related to anastrozole  therapy. Symptoms present but tolerable.  Fat necrosis of breast Tenderness likely due to fat necrosis. No lumps detected. Continue SBE monthly, report any changes Mammogram annually  Low blood pressure Blood pressure low at 107/54, asymptomatic. Discussed potential causes including leaky aortic valve, no definitive diagnosis. - Monitor blood pressure regularly. - Reassess if symptoms develop.    Total encounter time: 30 minutes*in face-to-face visit time, chart review, lab review, care coordination, order entry, and documentation of the encounter time.    Morna Kendall, NP 04/29/24 5:54 PM Medical Oncology and Hematology [facility] [address] [place], [place] [address] Tel. [phone]    Fax. [phone]  *Total Encounter Time as defined by the Centers for Medicare and Medicaid Services includes, in addition to the face-to-face time of a patient visit (documented in the note above) non-face-to-face time: obtaining and reviewing outside history, ordering and reviewing medications, tests or procedures, care coordination (communications with other health care professionals or  caregivers) and documentation in the medical record.

## 2024-04-29 NOTE — Progress Notes (Signed)
 BRIEF ONCOLOGIC HISTORY:  Oncology History  Malignant neoplasm of lower-outer quadrant of right breast of female, estrogen receptor positive (HCC)  02/12/2023 Mammogram   Diagnostic mammogram showed suspicious right breast mass corresponding with patient's palpable lump, recommend ultrasound-guided biopsy.  Indeterminate left breast calcs, recommend stereotactic biopsy.  No suspicious right axillary lymphadenopathy.   02/20/2023 Pathology Results   Right breast needle core biopsy showed overall grade 2 invasive mammary carcinoma, consistent with lobular phenotype, left breast needle core biopsy in the upper outer breast showed Fibrocystic changes with calcs, negative for carcinoma.  Prognostics from the invasive lobular carcinoma showed ER 90% positive strong staining PR 5% positive strong staining Ki-67 of 3% HER2 1+   03/05/2023 Initial Diagnosis   Malignant neoplasm of lower-outer quadrant of right breast of female, estrogen receptor positive (HCC)   03/06/2023 Cancer Staging   Staging form: Breast, AJCC 8th Edition - Clinical stage from 03/06/2023: Stage IA (cT1c, cN0, cM0, G2, ER+, PR+, HER2-) - Signed by Lanell Donald Stagger, PA-C on 03/06/2023 Stage prefix: Initial diagnosis Histologic grading system: 3 grade system    Genetic Testing   [facility] Common Cancer Panel+RNA was Negative. Report date is 03/12/2023.  The Common Hereditary Cancers Panel offered by [facility] includes sequencing and/or deletion duplication testing of the following 48 genes: APC, ATM, AXIN2, BAP1, BARD1, BMPR1A, BRCA1, BRCA2, BRIP1, CDH1, CDK4, CDKN2A (p14ARF and p16INK4a only), CHEK2, CTNNA1, DICER1, EPCAM (Deletion/duplication testing only), FH, GREM1 (promoter region duplication testing only), HOXB13, KIT, MBD4, MEN1, MLH1, MSH2, MSH3, MSH6, MUTYH, NF1, NHTL1, PALB2, PDGFRA, PMS2, POLD1, POLE, PTEN, RAD51C, RAD51D, SDHA (sequencing analysis only except exon 14), SDHB, SDHC, SDHD, SMAD4, SMARCA4. STK11, TP53, TSC1, TSC2,  and VHL.   04/12/2023 Definitive Surgery   Since her last visit she had right breast lumpectomy and this showed grade 2 1.4 cm measuring invasive lobular carcinoma, posterior margin focally positive for invasive carcinoma however no further surgery planned since posterior margin is the pectoralis major muscle.  Negative for lymphovascular or perineural invasion right axillary sentinel lymph node was negative for carcinoma.  Prior prognostic showed ER 90% positive strong staining PR 5% positive strong staining, HER2 negative 1+   04/12/2023 Oncotype testing   10/3%   04/12/2023 Cancer Staging   Staging form: Breast, AJCC 8th Edition - Pathologic stage from 04/12/2023: Stage IA (pT1c, pN0, cM0, G2, ER+, PR+, HER2-) - Signed by Crawford Morna Pickle, NP on 10/25/2023 Histologic grading system: 3 grade system   06/04/2023 - 07/01/2023 Radiation Therapy   Plan Name: Breast_R Site: Breast, Right Technique: 3D Mode: Photon Dose Per Fraction: 2.66 Gy Prescribed Dose (Delivered / Prescribed): 42.56 Gy / 42.56 Gy Prescribed Fxs (Delivered / Prescribed): 16 / 16   Plan Name: Breast_R_Bst Site: Breast, Right Technique: 3D Mode: Photon Dose Per Fraction: 2.5 Gy Prescribed Dose (Delivered / Prescribed): 10 Gy / 10 Gy Prescribed Fxs (Delivered / Prescribed): 4 / 4   07/2023 -  Anti-estrogen oral therapy   Anastrozole      INTERVAL HISTORY:  [name], with a history of stage 1A right-sided breast cancer   REVIEW OF SYSTEMS:  Review of Systems  Constitutional:  Positive for fatigue. Negative for appetite change, chills, fever and unexpected weight change.  HENT:   Negative for hearing loss, lump/mass and trouble swallowing.   Eyes:  Negative for eye problems and icterus.  Respiratory:  Negative for chest tightness, cough and shortness of breath.   Cardiovascular:  Negative for chest pain, leg swelling and palpitations.  Gastrointestinal:  Negative for abdominal distention, abdominal pain,  constipation, diarrhea, nausea and vomiting.  Endocrine: Positive for hot flashes.  Genitourinary:  Negative for difficulty urinating.   Musculoskeletal:  Negative for arthralgias.  Skin:  Negative for itching and rash.  Neurological:  Negative for dizziness, extremity weakness, headaches and numbness.  Hematological:  Negative for adenopathy. Does not bruise/bleed easily.  Psychiatric/Behavioral:  Negative for depression. The patient is not nervous/anxious.   Breast: Denies any new nodularity, masses, tenderness, nipple changes, or nipple discharge.    PAST MEDICAL/SURGICAL HISTORY:  Past Medical History:  Diagnosis Date   Abnormal Pap smear of cervix    yrs ago   Actinic keratosis    Allergy    Anemia    Anxiety    Asthma    Blood transfusion without reported diagnosis    breast cancer    Chlamydia    treated yrs ago   Depression    in the past   Gonorrhea    treated years ago   Hot flashes    Ocular migraine    STD (sexually transmitted disease)    HSV vaginal   Past Surgical History:  Procedure Laterality Date   ABLATION     BREAST BIOPSY Left 02/20/2023   MM LT BREAST BX W LOC DEV 1ST LESION IMAGE BX SPEC STEREO GUIDE 02/20/2023 [facility]   BREAST BIOPSY Right 02/20/2023   US  RT BREAST BX W LOC DEV 1ST LESION IMG BX SPEC US  GUIDE 02/20/2023 [facility]   BREAST LUMPECTOMY WITH SENTINEL LYMPH NODE BIOPSY Right 04/12/2023   Procedure: RIGHT BREAST LUMPECTOMY WITH SENTINEL LYMPH NODE BX;  Surgeon: Vernetta Berg, MD;  Location: [facility];  Service: General;  Laterality: Right;   CESAREAN SECTION     CRYOTHERAPY     for abnormal pap smear   DILATION AND CURETTAGE OF UTERUS     ECTOPIC PREGNANCY SURGERY     TUBAL LIGATION     WISDOM TOOTH EXTRACTION       ALLERGIES:  Allergies  Allergen Reactions   Cefaclor Anaphylaxis    REACTION: anaphalaxis   Sulfa Antibiotics Hives     CURRENT MEDICATIONS:  Outpatient Encounter  Medications as of 04/29/2024  Medication Sig   anastrozole  (ARIMIDEX ) 1 MG tablet Take 1 tablet (1 mg total) by mouth daily.   atorvastatin  (LIPITOR) 10 MG tablet Take 1 tablet (10 mg total) by mouth daily.   buPROPion  (WELLBUTRIN  XL) 150 MG 24 hr tablet Take 1 tablet (150 mg total) by mouth daily.   chlorhexidine  (HIBICLENS ) 4 % external liquid Apply topically daily as needed.   loratadine (CLARITIN) 10 MG tablet Take 10 mg by mouth daily.   Multiple Vitamin (MULTIVITAMIN ADULT PO) Take by mouth.   valACYclovir  (VALTREX ) 500 MG tablet Take 1 tablet (500 mg) by mouth 2 times daily.   Zinc 50 MG TABS Take by mouth daily.   No facility-administered encounter medications on file as of 04/29/2024.     ONCOLOGIC FAMILY HISTORY:  Family History  Problem Relation Age of Onset   Hyperlipidemia Mother    Diabetes Mother    Hypertension Mother    Stroke Father    Diabetes Father    Hyperlipidemia Father    Breast cancer Sister 21   Hyperlipidemia Sister    Skin cancer Sister    Hyperlipidemia Brother    Esophageal cancer Brother 5 - 23   Colon cancer Maternal Aunt 50 - 69   Liver cancer Maternal Uncle  Stomach cancer Maternal Uncle    Leukemia Paternal Uncle    Other Maternal Grandmother    Stroke Maternal Grandmother    Other Maternal Grandfather        pacemaker   Stroke Paternal Grandmother    Skin cancer Paternal Grandmother    Cancer Paternal Grandmother        vulvar cancer     SOCIAL HISTORY:  Social History   Socioeconomic History   Marital status: Married    Spouse name: Not on file   Number of children: Not on file   Years of education: Not on file   Highest education level: Not on file  Occupational History   Not on file  Tobacco Use   Smoking status: Some Days    Current packs/day: 0.00    Types: Cigarettes    Last attempt to quit: 10/01/2005    Years since quitting: 18.5   Smokeless tobacco: Never  Substance and Sexual Activity   Alcohol use: Yes     Comment: occasional   Drug use: No   Sexual activity: Yes    Partners: Male    Birth control/protection: Post-menopausal  Other Topics Concern   Not on file  Social History Narrative   Not on file   Social Drivers of Health   Financial Resource Strain: Not on file  Food Insecurity: No Food Insecurity (05/15/2023)   Hunger Vital Sign    Worried About Running Out of Food in the Last Year: Never true    Ran Out of Food in the Last Year: Never true  Transportation Needs: No Transportation Needs (03/06/2023)   PRAPARE - Administrator, Civil Service (Medical): No    Lack of Transportation (Non-Medical): No  Physical Activity: Not on file  Stress: Not on file  Social Connections: Not on file  Intimate Partner Violence: Not At Risk (03/06/2023)   Humiliation, Afraid, Rape, and Kick questionnaire    Fear of Current or Ex-Partner: No    Emotionally Abused: No    Physically Abused: No    Sexually Abused: No     OBSERVATIONS/OBJECTIVE:  There were no vitals taken for this visit. GENERAL: Patient is a well appearing female in no acute distress HEENT:  Sclerae anicteric.  Oropharynx clear and moist. No ulcerations or evidence of oropharyngeal candidiasis. Neck is supple.  NODES:  No cervical, supraclavicular, or axillary lymphadenopathy palpated.  BREAST EXAM: Right breast status postlumpectomy and radiation no sign of local recurrence left breast is benign. LUNGS:  Clear to auscultation bilaterally.  No wheezes or rhonchi. HEART:  Regular rate and rhythm. No murmur appreciated. ABDOMEN:  Soft, nontender.  Positive, normoactive bowel sounds. No organomegaly palpated. MSK:  No focal spinal tenderness to palpation. Full range of motion bilaterally in the upper extremities. EXTREMITIES:  No peripheral edema.   SKIN:  Clear with no obvious rashes or skin changes. No nail dyscrasia. NEURO:  Nonfocal. Well oriented.  Appropriate affect.   LABORATORY DATA:  None for this  visit.  DIAGNOSTIC IMAGING:  None for this visit.      ASSESSMENT AND PLAN:  Abigail Foley is a pleasant 63 y.o. female with Stage 1A right breast invasive ductal carcinoma, ER+/PR+/HER2-, diagnosed in 01/2023, treated with lumpectomy, adjuvant radiation therapy, and anti-estrogen therapy with anastrozole  beginning in 07/2023.  She presents to the Survivorship Clinic for our initial meeting and routine follow-up post-completion of treatment for breast cancer.    1. Stage 1A right breast cancer:  Ms. Gatz  is continuing to recover from definitive treatment for breast cancer. She will follow-up with her medical oncologist, Dr.  Loretha in 6 months with history and physical exam per surveillance protocol.  She will continue her anti-estrogen therapy with Anastrozole . Thus far, she is tolerating the Anastrozole  moderately well, and if her side effects increase, she knows to reach out so we can discuss adjustments in her medication. Her mammogram is due 01/2024; orders placed today.   Today, a comprehensive survivorship care plan and treatment summary was reviewed with the patient today detailing her breast cancer diagnosis, treatment course, potential late/long-term effects of treatment, appropriate follow-up care with recommendations for the future, and patient education resources.  A copy of this summary, along with a letter will be sent to the patient's primary care provider via mail/fax/In Basket message after today's visit.    2. Bone health:  Given Ms. Tschirhart's age/history of breast cancer and her current treatment regimen including anti-estrogen therapy with Anastrozole , she is at risk for bone demineralization.  Her last DEXA scan was 07/2023 was normal. Repeat is recommended in 07/2025.  She was given education on specific activities to promote bone health.  3. Cancer screening:  Due to Ms. Nourse's history and her age, she should receive screening for skin cancers, colon cancer, and  gynecologic cancers.  The information and recommendations are listed on the patient's comprehensive care plan/treatment summary and were reviewed in detail with the patient.  Also eligible for lung cancer screening and presents to talk to her primary care provider about this.  We discussed smoking cessation as well today.  4. Health maintenance and wellness promotion: Ms. Alejo was encouraged to consume 5-7 servings of fruits and vegetables per day. We reviewed the [employer] handout.  She was also encouraged to engage in moderate to vigorous exercise for 30 minutes per day most days of the week.  She was instructed to limit her alcohol consumption and recommended to stop smoking.   5. Support services/counseling: It is not uncommon for this period of the patient's cancer care trajectory to be one of many emotions and stressors.   She was given information regarding our available services and encouraged to contact me with any questions or for help enrolling in any of our support group/programs.    Follow up instructions:    -Return to cancer center 03/2024 for follow-up with Dr. Loretha -Mammogram due in 01/2024 -Bone density testing due in October 2026 -She is welcome to return back to the Survivorship Clinic at any time; no additional follow-up needed at this time.  -Consider referral back to survivorship as a long-term survivor for continued surveillance  The patient was provided an opportunity to ask questions and all were answered. The patient agreed with the plan and demonstrated an understanding of the instructions.   Total encounter time: 30 minutes*in face-to-face visit time, chart review, lab review, care coordination, order entry, and documentation of the encounter time.    Morna Kendall, NP 04/29/24 3:53 PM Medical Oncology and Hematology [facility] [address] [place], [place] [address] Tel. [phone]    Fax. [phone]  *Total Encounter Time  as defined by the Centers for Medicare and Medicaid Services includes, in addition to the face-to-face time of a patient visit (documented in the note above) non-face-to-face time: obtaining and reviewing outside history, ordering and reviewing medications, tests or procedures, care coordination (communications with other health care professionals or caregivers) and documentation in the medical record.

## 2024-05-06 ENCOUNTER — Other Ambulatory Visit (HOSPITAL_COMMUNITY): Payer: Self-pay

## 2024-05-15 ENCOUNTER — Other Ambulatory Visit: Payer: Self-pay | Admitting: Internal Medicine

## 2024-05-15 ENCOUNTER — Other Ambulatory Visit (HOSPITAL_COMMUNITY): Payer: Self-pay

## 2024-05-15 MED ORDER — BUPROPION HCL ER (XL) 150 MG PO TB24
150.0000 mg | ORAL_TABLET | Freq: Every day | ORAL | 0 refills | Status: DC
  Filled 2024-05-15: qty 90, 90d supply, fill #0

## 2024-06-02 ENCOUNTER — Other Ambulatory Visit (HOSPITAL_COMMUNITY): Payer: Self-pay

## 2024-07-14 ENCOUNTER — Ambulatory Visit: Admitting: Dermatology

## 2024-07-14 DIAGNOSIS — L570 Actinic keratosis: Secondary | ICD-10-CM | POA: Diagnosis not present

## 2024-07-14 DIAGNOSIS — L821 Other seborrheic keratosis: Secondary | ICD-10-CM

## 2024-07-14 DIAGNOSIS — D229 Melanocytic nevi, unspecified: Secondary | ICD-10-CM

## 2024-07-14 DIAGNOSIS — W908XXA Exposure to other nonionizing radiation, initial encounter: Secondary | ICD-10-CM | POA: Diagnosis not present

## 2024-07-14 DIAGNOSIS — L578 Other skin changes due to chronic exposure to nonionizing radiation: Secondary | ICD-10-CM

## 2024-07-14 DIAGNOSIS — L814 Other melanin hyperpigmentation: Secondary | ICD-10-CM

## 2024-07-14 DIAGNOSIS — D1801 Hemangioma of skin and subcutaneous tissue: Secondary | ICD-10-CM

## 2024-07-14 DIAGNOSIS — Z1283 Encounter for screening for malignant neoplasm of skin: Secondary | ICD-10-CM | POA: Diagnosis not present

## 2024-07-14 NOTE — Patient Instructions (Signed)

## 2024-07-14 NOTE — Progress Notes (Signed)
   Follow-Up Visit   Subjective  Abigail Foley is a 63 y.o. female who presents for the following: Skin Cancer Screening and Full Body Skin Exam  Last skin exam was 02/14/23. No previous hx of skin cancer. Past history of AK. The spots of concern on the face.  The patient presents for Total-Body Skin Exam (TBSE) for skin cancer screening and mole check. The patient has spots, moles and lesions to be evaluated, some may be new or changing and the patient may have concern these could be cancer.    The following portions of the chart were reviewed this encounter and updated as appropriate: medications, allergies, medical history  Review of Systems:  No other skin or systemic complaints except as noted in HPI or Assessment and Plan.  Objective  Well appearing patient in no apparent distress; mood and affect are within normal limits.  A full examination was performed including scalp, head, eyes, ears, nose, lips, neck, chest, axillae, abdomen, back, buttocks, bilateral upper extremities, bilateral lower extremities, hands, feet, fingers, toes, fingernails, and toenails. All findings within normal limits unless otherwise noted below.   Relevant physical exam findings are noted in the Assessment and Plan.  Right Parotid Area Erythematous thin papules/macules with gritty scale.   Assessment & Plan   SKIN CANCER SCREENING PERFORMED TODAY.  ACTINIC DAMAGE - Chronic condition, secondary to cumulative UV/sun exposure - diffuse scaly erythematous macules with underlying dyspigmentation - Recommend daily broad spectrum sunscreen SPF 30+ to sun-exposed areas, reapply every 2 hours as needed.  - Staying in the shade or wearing long sleeves, sun glasses (UVA+UVB protection) and wide brim hats (4-inch brim around the entire circumference of the hat) are also recommended for sun protection.  - Call for new or changing lesions.  LENTIGINES, SEBORRHEIC KERATOSES, HEMANGIOMAS - Benign normal skin  lesions - Benign-appearing - Call for any changes  MELANOCYTIC NEVI - Tan-brown and/or pink-flesh-colored symmetric macules and papules - Benign appearing on exam today - Observation - Call clinic for new or changing moles - Recommend daily use of broad spectrum spf 30+ sunscreen to sun-exposed areas.       AK (ACTINIC KERATOSIS) Right Parotid Area Destruction of lesion - Right Parotid Area Complexity: simple   Destruction method: cryotherapy   Informed consent: discussed and consent obtained   Timeout:  patient name, date of birth, surgical site, and procedure verified Lesion destroyed using liquid nitrogen: Yes   Region frozen until ice ball extended beyond lesion: Yes   Outcome: patient tolerated procedure well with no complications   Post-procedure details: wound care instructions given    Return in about 1 year (around 07/14/2025) for tbse.  I, Gordan Beams, CMA, am acting as scribe for [employer], DO.   Documentation: I have reviewed the above documentation for accuracy and completeness, and I agree with the above.  Delon Lenis, DO

## 2024-07-22 ENCOUNTER — Encounter: Payer: Self-pay | Admitting: Dermatology

## 2024-07-28 ENCOUNTER — Ambulatory Visit (INDEPENDENT_AMBULATORY_CARE_PROVIDER_SITE_OTHER): Admitting: Internal Medicine

## 2024-07-28 ENCOUNTER — Encounter: Payer: Self-pay | Admitting: Internal Medicine

## 2024-07-28 VITALS — BP 110/60 | HR 83 | Temp 98.1°F | Ht 64.5 in | Wt 156.4 lb

## 2024-07-28 DIAGNOSIS — Z170 Estrogen receptor positive status [ER+]: Secondary | ICD-10-CM

## 2024-07-28 DIAGNOSIS — C50511 Malignant neoplasm of lower-outer quadrant of right female breast: Secondary | ICD-10-CM | POA: Diagnosis not present

## 2024-07-28 DIAGNOSIS — Z0000 Encounter for general adult medical examination without abnormal findings: Secondary | ICD-10-CM

## 2024-07-28 DIAGNOSIS — E782 Mixed hyperlipidemia: Secondary | ICD-10-CM | POA: Diagnosis not present

## 2024-07-28 LAB — COMPREHENSIVE METABOLIC PANEL WITH GFR
ALT: 13 U/L (ref 0–35)
AST: 15 U/L (ref 0–37)
Albumin: 4.6 g/dL (ref 3.5–5.2)
Alkaline Phosphatase: 77 U/L (ref 39–117)
BUN: 14 mg/dL (ref 6–23)
CO2: 29 meq/L (ref 19–32)
Calcium: 9.5 mg/dL (ref 8.4–10.5)
Chloride: 106 meq/L (ref 96–112)
Creatinine, Ser: 0.83 mg/dL (ref 0.40–1.20)
GFR: 74.7 mL/min (ref >60.00)
Glucose, Bld: 93 mg/dL (ref 70–99)
Potassium: 4.1 meq/L (ref 3.5–5.1)
Sodium: 143 meq/L (ref 135–145)
Total Bilirubin: 0.4 mg/dL (ref 0.2–1.2)
Total Protein: 7.2 g/dL (ref 6.0–8.3)

## 2024-07-28 LAB — HEMOGLOBIN A1C: Hgb A1c MFr Bld: 5.9 % (ref 4.6–6.5)

## 2024-07-28 LAB — LIPID PANEL
Cholesterol: 168 mg/dL (ref 0–200)
HDL: 53.7 mg/dL (ref >39.00)
LDL Cholesterol: 78 mg/dL (ref 0–99)
NonHDL: 114.26
Total CHOL/HDL Ratio: 3
Triglycerides: 183 mg/dL — ABNORMAL HIGH (ref 0.0–149.0)
VLDL: 36.6 mg/dL (ref 0.0–40.0)

## 2024-07-28 LAB — CBC
HCT: 36.6 % (ref 36.0–46.0)
Hemoglobin: 12.6 g/dL (ref 12.0–15.0)
MCHC: 34.4 g/dL (ref 30.0–36.0)
MCV: 95.5 fl (ref 78.0–100.0)
Platelets: 264 K/uL (ref 150.0–400.0)
RBC: 3.83 Mil/uL — ABNORMAL LOW (ref 3.87–5.11)
RDW: 12 % (ref 11.5–15.5)
WBC: 6.1 K/uL (ref 4.0–10.5)

## 2024-07-28 NOTE — Progress Notes (Unsigned)
   Subjective:   Patient ID: Abigail Foley, female    DOB: 08-Jul-1961, 63 y.o.   MRN: [id number]  The patient is here for physical. Pertinent topics discussed: Discussed the use of AI scribe software for clinical note transcription with the patient, who gave verbal consent to proceed.  History of Present Illness Abigail Foley is a 63 year old female with breast cancer who presents for follow-up and management of ongoing symptoms related to Arimidex  use.  She has been experiencing joint aches, significant hot flashes, and perceived skin aging since starting Arimidex  last year for breast cancer treatment. Her dermatologist suggested that these symptoms could be related to her treatment.  No new chest pain, tightness, or breathing problems. She has not resumed smoking cigarettes after quitting in September, despite briefly smoking over the summer following the loss of her dog. She acknowledges that she could be exercising more but has been walking to help cope with stress.  She feels bloated and gassy, which she attributes to her recent meal. Her colon cancer screening last year was normal, and she is not due for another for four more years. Her mammogram this year was normal, and her Pap smear last year was normal.  She recently had a skin check and a mole removed by her dermatologist. She tends to develop moles, which she attributes to genetic factors.  Her weight has increased from 144 pounds in July to 156 pounds currently.  PMH, [facility], social history reviewed and updated  Review of Systems  Objective:  Physical Exam  There were no vitals filed for this visit.  Assessment & Plan:

## 2024-07-30 NOTE — Assessment & Plan Note (Signed)
 Flu shot up to date. Pneumonia counseled. Shingrix counseled. Tetanus counseled. Colonoscopy up to date. Mammogram up to date, pap smear up to date. Counseled about sun safety and mole surveillance. Counseled about the dangers of distracted driving. Given 10 year screening recommendations.

## 2024-07-30 NOTE — Assessment & Plan Note (Signed)
 Taking arimidex  and will continue yearly mammograms. Continue.

## 2024-07-30 NOTE — Assessment & Plan Note (Signed)
 Checking lipid panel and adjust as needed.

## 2024-07-31 ENCOUNTER — Ambulatory Visit: Payer: Self-pay | Admitting: Internal Medicine

## 2024-08-13 ENCOUNTER — Other Ambulatory Visit: Payer: Self-pay | Admitting: Internal Medicine

## 2024-08-13 ENCOUNTER — Other Ambulatory Visit: Payer: Self-pay | Admitting: Hematology and Oncology

## 2024-08-14 ENCOUNTER — Other Ambulatory Visit: Payer: Self-pay

## 2024-08-14 ENCOUNTER — Other Ambulatory Visit (HOSPITAL_COMMUNITY): Payer: Self-pay

## 2024-08-14 MED ORDER — ANASTROZOLE 1 MG PO TABS
1.0000 mg | ORAL_TABLET | Freq: Every day | ORAL | 3 refills | Status: AC
  Filled 2024-08-14 – 2024-09-06 (×2): qty 90, 90d supply, fill #0

## 2024-08-14 MED ORDER — BUPROPION HCL ER (XL) 150 MG PO TB24
150.0000 mg | ORAL_TABLET | Freq: Every day | ORAL | 0 refills | Status: AC
  Filled 2024-08-14: qty 90, 90d supply, fill #0

## 2024-08-14 MED ORDER — ATORVASTATIN CALCIUM 10 MG PO TABS
10.0000 mg | ORAL_TABLET | Freq: Every day | ORAL | 3 refills | Status: AC
  Filled 2024-08-14: qty 90, 90d supply, fill #0

## 2024-09-07 ENCOUNTER — Other Ambulatory Visit: Payer: Self-pay

## 2024-09-07 ENCOUNTER — Other Ambulatory Visit (HOSPITAL_COMMUNITY): Payer: Self-pay

## 2024-10-23 ENCOUNTER — Telehealth: Payer: Self-pay | Admitting: Hematology and Oncology

## 2024-10-23 NOTE — Telephone Encounter (Signed)
 I spoke with patient to reschedule MD appointment from 10/28/2024 to 11/25/2024 per her request due to potential inclement weather.

## 2024-10-28 ENCOUNTER — Inpatient Hospital Stay: Admitting: Hematology and Oncology

## 2024-11-25 ENCOUNTER — Inpatient Hospital Stay: Admitting: Hematology and Oncology

## 2025-07-15 ENCOUNTER — Ambulatory Visit: Admitting: Dermatology
